# Patient Record
Sex: Female | Born: 1940 | Race: White | Hispanic: No | Smoking: Former smoker
Health system: Southern US, Community
[De-identification: ages and names within clinical notes are randomized; demographics above are authoritative.]

## PROBLEM LIST (undated history)

## (undated) DIAGNOSIS — F0390 Unspecified dementia without behavioral disturbance: Secondary | ICD-10-CM

## (undated) DIAGNOSIS — I350 Nonrheumatic aortic (valve) stenosis: Secondary | ICD-10-CM

## (undated) DIAGNOSIS — I4891 Unspecified atrial fibrillation: Secondary | ICD-10-CM

## (undated) DIAGNOSIS — I10 Essential (primary) hypertension: Secondary | ICD-10-CM

## (undated) DIAGNOSIS — W19XXXA Unspecified fall, initial encounter: Secondary | ICD-10-CM

## (undated) DIAGNOSIS — F329 Major depressive disorder, single episode, unspecified: Secondary | ICD-10-CM

## (undated) DIAGNOSIS — F17200 Nicotine dependence, unspecified, uncomplicated: Secondary | ICD-10-CM

## (undated) DIAGNOSIS — I509 Heart failure, unspecified: Secondary | ICD-10-CM

## (undated) DIAGNOSIS — E039 Hypothyroidism, unspecified: Secondary | ICD-10-CM

## (undated) DIAGNOSIS — E079 Disorder of thyroid, unspecified: Secondary | ICD-10-CM

## (undated) DIAGNOSIS — J449 Chronic obstructive pulmonary disease, unspecified: Secondary | ICD-10-CM

## (undated) DIAGNOSIS — D649 Anemia, unspecified: Secondary | ICD-10-CM

## (undated) DIAGNOSIS — I429 Cardiomyopathy, unspecified: Secondary | ICD-10-CM

## (undated) DIAGNOSIS — M1990 Unspecified osteoarthritis, unspecified site: Secondary | ICD-10-CM

## (undated) HISTORY — PX: CARDIAC SURGERY: SHX584

## (undated) HISTORY — PX: PACEMAKER INSERTION: SHX728

## (undated) HISTORY — PX: ABDOMINAL HYSTERECTOMY: SHX81

## (undated) HISTORY — PX: APPENDECTOMY: SHX54

---

## 1998-01-04 ENCOUNTER — Other Ambulatory Visit: Admission: RE | Admit: 1998-01-04 | Discharge: 1998-01-04 | Payer: Self-pay | Admitting: Obstetrics and Gynecology

## 1999-01-12 ENCOUNTER — Other Ambulatory Visit: Admission: RE | Admit: 1999-01-12 | Discharge: 1999-01-12 | Payer: Self-pay | Admitting: Obstetrics and Gynecology

## 2000-02-20 ENCOUNTER — Other Ambulatory Visit: Admission: RE | Admit: 2000-02-20 | Discharge: 2000-02-20 | Payer: Self-pay | Admitting: *Deleted

## 2003-01-08 ENCOUNTER — Encounter: Admission: RE | Admit: 2003-01-08 | Discharge: 2003-01-08 | Payer: Self-pay | Admitting: Family Medicine

## 2003-11-19 ENCOUNTER — Encounter: Admission: RE | Admit: 2003-11-19 | Discharge: 2003-11-19 | Payer: Self-pay | Admitting: Family Medicine

## 2006-12-10 ENCOUNTER — Encounter: Admission: RE | Admit: 2006-12-10 | Discharge: 2006-12-10 | Payer: Self-pay | Admitting: Family Medicine

## 2012-08-18 DIAGNOSIS — I509 Heart failure, unspecified: Secondary | ICD-10-CM

## 2012-10-29 DIAGNOSIS — I429 Cardiomyopathy, unspecified: Secondary | ICD-10-CM | POA: Insufficient documentation

## 2012-10-29 DIAGNOSIS — I350 Nonrheumatic aortic (valve) stenosis: Secondary | ICD-10-CM

## 2016-08-07 DIAGNOSIS — I509 Heart failure, unspecified: Secondary | ICD-10-CM | POA: Insufficient documentation

## 2016-08-07 DIAGNOSIS — J449 Chronic obstructive pulmonary disease, unspecified: Secondary | ICD-10-CM | POA: Diagnosis present

## 2016-08-08 ENCOUNTER — Encounter (HOSPITAL_COMMUNITY): Payer: Self-pay

## 2016-08-08 ENCOUNTER — Emergency Department (HOSPITAL_COMMUNITY): Payer: Medicare Other

## 2016-08-08 ENCOUNTER — Observation Stay (HOSPITAL_COMMUNITY)
Admission: EM | Admit: 2016-08-08 | Discharge: 2016-08-09 | Disposition: A | Discharge status: Home / Self Care | Payer: Medicare Other | Attending: Internal Medicine | Admitting: Internal Medicine

## 2016-08-08 DIAGNOSIS — X58XXXS Exposure to other specified factors, sequela: Secondary | ICD-10-CM | POA: Diagnosis not present

## 2016-08-08 DIAGNOSIS — Z681 Body mass index (BMI) 19 or less, adult: Secondary | ICD-10-CM | POA: Diagnosis not present

## 2016-08-08 DIAGNOSIS — E46 Unspecified protein-calorie malnutrition: Secondary | ICD-10-CM | POA: Insufficient documentation

## 2016-08-08 DIAGNOSIS — I4510 Unspecified right bundle-branch block: Secondary | ICD-10-CM | POA: Insufficient documentation

## 2016-08-08 DIAGNOSIS — Z803 Family history of malignant neoplasm of breast: Secondary | ICD-10-CM | POA: Diagnosis not present

## 2016-08-08 DIAGNOSIS — I350 Nonrheumatic aortic (valve) stenosis: Secondary | ICD-10-CM | POA: Diagnosis not present

## 2016-08-08 DIAGNOSIS — I255 Ischemic cardiomyopathy: Secondary | ICD-10-CM | POA: Insufficient documentation

## 2016-08-08 DIAGNOSIS — I5022 Chronic systolic (congestive) heart failure: Secondary | ICD-10-CM | POA: Insufficient documentation

## 2016-08-08 DIAGNOSIS — E876 Hypokalemia: Principal | ICD-10-CM | POA: Diagnosis present

## 2016-08-08 DIAGNOSIS — I080 Rheumatic disorders of both mitral and aortic valves: Secondary | ICD-10-CM | POA: Diagnosis not present

## 2016-08-08 DIAGNOSIS — I351 Nonrheumatic aortic (valve) insufficiency: Secondary | ICD-10-CM | POA: Diagnosis not present

## 2016-08-08 DIAGNOSIS — I272 Pulmonary hypertension, unspecified: Secondary | ICD-10-CM | POA: Insufficient documentation

## 2016-08-08 DIAGNOSIS — I110 Hypertensive heart disease with heart failure: Secondary | ICD-10-CM | POA: Insufficient documentation

## 2016-08-08 DIAGNOSIS — Z888 Allergy status to other drugs, medicaments and biological substances status: Secondary | ICD-10-CM | POA: Insufficient documentation

## 2016-08-08 DIAGNOSIS — I509 Heart failure, unspecified: Secondary | ICD-10-CM

## 2016-08-08 DIAGNOSIS — I428 Other cardiomyopathies: Secondary | ICD-10-CM | POA: Diagnosis not present

## 2016-08-08 DIAGNOSIS — Z79899 Other long term (current) drug therapy: Secondary | ICD-10-CM | POA: Diagnosis not present

## 2016-08-08 DIAGNOSIS — Z7982 Long term (current) use of aspirin: Secondary | ICD-10-CM | POA: Diagnosis not present

## 2016-08-08 DIAGNOSIS — I340 Nonrheumatic mitral (valve) insufficiency: Secondary | ICD-10-CM | POA: Diagnosis not present

## 2016-08-08 DIAGNOSIS — J449 Chronic obstructive pulmonary disease, unspecified: Secondary | ICD-10-CM | POA: Diagnosis not present

## 2016-08-08 DIAGNOSIS — R0600 Dyspnea, unspecified: Secondary | ICD-10-CM

## 2016-08-08 DIAGNOSIS — D649 Anemia, unspecified: Secondary | ICD-10-CM | POA: Diagnosis not present

## 2016-08-08 DIAGNOSIS — E43 Unspecified severe protein-calorie malnutrition: Secondary | ICD-10-CM | POA: Insufficient documentation

## 2016-08-08 DIAGNOSIS — Z881 Allergy status to other antibiotic agents status: Secondary | ICD-10-CM | POA: Diagnosis not present

## 2016-08-08 DIAGNOSIS — F17210 Nicotine dependence, cigarettes, uncomplicated: Secondary | ICD-10-CM | POA: Insufficient documentation

## 2016-08-08 DIAGNOSIS — S42031S Displaced fracture of lateral end of right clavicle, sequela: Secondary | ICD-10-CM | POA: Insufficient documentation

## 2016-08-08 DIAGNOSIS — R64 Cachexia: Secondary | ICD-10-CM

## 2016-08-08 LAB — CBC
HCT: 40.6 % (ref 36.0–46.0)
Hemoglobin: 13.1 g/dL (ref 12.0–15.0)
MCH: 29.6 pg (ref 26.0–34.0)
MCHC: 32.3 g/dL (ref 30.0–36.0)
MCV: 91.9 fL (ref 78.0–100.0)
Platelets: 319 10*3/uL (ref 150–400)
RBC: 4.42 MIL/uL (ref 3.87–5.11)
RDW: 14 % (ref 11.5–15.5)
WBC: 9.3 10*3/uL (ref 4.0–10.5)

## 2016-08-08 LAB — COMPREHENSIVE METABOLIC PANEL
ALT: 14 U/L (ref 14–54)
AST: 24 U/L (ref 15–41)
Albumin: 3.7 g/dL (ref 3.5–5.0)
Alkaline Phosphatase: 88 U/L (ref 38–126)
Anion gap: 11 (ref 5–15)
BUN: 5 mg/dL — ABNORMAL LOW (ref 6–20)
CO2: 36 mmol/L — ABNORMAL HIGH (ref 22–32)
Calcium: 8.9 mg/dL (ref 8.9–10.3)
Chloride: 94 mmol/L — ABNORMAL LOW (ref 101–111)
Creatinine, Ser: 0.95 mg/dL (ref 0.44–1.00)
GFR calc Af Amer: >60 mL/min (ref >60)
GFR calc non Af Amer: 57 mL/min — ABNORMAL LOW (ref >60)
Glucose, Bld: 106 mg/dL — ABNORMAL HIGH (ref 65–99)
Potassium: <2 mmol/L — CL (ref 3.5–5.1)
Sodium: 141 mmol/L (ref 135–145)
Total Bilirubin: 1.1 mg/dL (ref 0.3–1.2)
Total Protein: 6.8 g/dL (ref 6.5–8.1)

## 2016-08-08 LAB — BASIC METABOLIC PANEL
Anion gap: 10 (ref 5–15)
BUN: 7 mg/dL (ref 6–20)
CHLORIDE: 100 mmol/L — AB (ref 101–111)
CO2: 32 mmol/L (ref 22–32)
CREATININE: 0.98 mg/dL (ref 0.44–1.00)
Calcium: 8.5 mg/dL — ABNORMAL LOW (ref 8.9–10.3)
GFR calc Af Amer: >60 mL/min (ref >60)
GFR, EST NON AFRICAN AMERICAN: 55 mL/min — AB (ref >60)
GLUCOSE: 175 mg/dL — AB (ref 65–99)
Potassium: 2.9 mmol/L — ABNORMAL LOW (ref 3.5–5.1)
SODIUM: 142 mmol/L (ref 135–145)

## 2016-08-08 LAB — TYPE AND SCREEN
ABO/RH(D): O POS
Antibody Screen: NEGATIVE

## 2016-08-08 LAB — MAGNESIUM: Magnesium: 1.8 mg/dL (ref 1.7–2.4)

## 2016-08-08 LAB — ABO/RH: ABO/RH(D): O POS

## 2016-08-08 MED ORDER — POTASSIUM CHLORIDE CRYS ER 20 MEQ PO TBCR
60.0000 meq | EXTENDED_RELEASE_TABLET | Freq: Once | ORAL | Status: AC
  Administered 2016-08-08: 60 meq via ORAL
  Filled 2016-08-08: qty 3

## 2016-08-08 MED ORDER — IPRATROPIUM-ALBUTEROL 0.5-2.5 (3) MG/3ML IN SOLN
3.0000 mL | Freq: Once | RESPIRATORY_TRACT | Status: AC
Start: 2016-08-08 — End: 2016-08-08
  Administered 2016-08-08: 3 mL via RESPIRATORY_TRACT
  Filled 2016-08-08: qty 3

## 2016-08-08 MED ORDER — POTASSIUM CHLORIDE 10 MEQ/100ML IV SOLN
10.0000 meq | INTRAVENOUS | Status: AC
  Administered 2016-08-08 (×4): 10 meq via INTRAVENOUS
  Filled 2016-08-08 (×4): qty 100

## 2016-08-08 MED ORDER — SODIUM CHLORIDE 0.9% FLUSH
3.0000 mL | Freq: Two times a day (BID) | INTRAVENOUS | Status: DC
  Administered 2016-08-08 – 2016-08-09 (×3): 3 mL via INTRAVENOUS

## 2016-08-08 MED ORDER — ASPIRIN EC 81 MG PO TBEC
81.0000 mg | DELAYED_RELEASE_TABLET | ORAL | Status: DC
  Administered 2016-08-08: 81 mg via ORAL
  Filled 2016-08-08: qty 1

## 2016-08-08 MED ORDER — SODIUM CHLORIDE 0.9 % IV SOLN
Freq: Once | INTRAVENOUS | Status: AC
  Administered 2016-08-08: 12:00:00 via INTRAVENOUS

## 2016-08-08 MED ORDER — POLYETHYLENE GLYCOL 3350 17 G PO PACK: 17.0000 g | PACK | Freq: Every day | ORAL | Status: DC | PRN

## 2016-08-08 MED ORDER — ORAL CARE MOUTH RINSE
15.0000 mL | Freq: Two times a day (BID) | OROMUCOSAL | Status: DC
  Administered 2016-08-08 – 2016-08-09 (×2): 15 mL via OROMUCOSAL

## 2016-08-08 MED ORDER — ACETAMINOPHEN 650 MG RE SUPP: 650.0000 mg | Freq: Four times a day (QID) | RECTAL | Status: DC | PRN

## 2016-08-08 MED ORDER — MAGNESIUM SULFATE 2 GM/50ML IV SOLN
2.0000 g | Freq: Once | INTRAVENOUS | Status: AC
  Administered 2016-08-08: 2 g via INTRAVENOUS
  Filled 2016-08-08: qty 50

## 2016-08-08 MED ORDER — ENSURE ENLIVE PO LIQD
237.0000 mL | Freq: Two times a day (BID) | ORAL | Status: DC
  Administered 2016-08-08 – 2016-08-09 (×2): 237 mL via ORAL

## 2016-08-08 MED ORDER — ENOXAPARIN SODIUM 40 MG/0.4ML ~~LOC~~ SOLN
40.0000 mg | SUBCUTANEOUS | Status: DC
  Administered 2016-08-08: 40 mg via SUBCUTANEOUS
  Filled 2016-08-08: qty 0.4

## 2016-08-08 MED ORDER — MOMETASONE FURO-FORMOTEROL FUM 200-5 MCG/ACT IN AERO
2.0000 | INHALATION_SPRAY | Freq: Two times a day (BID) | RESPIRATORY_TRACT | Status: DC
  Administered 2016-08-08 – 2016-08-09 (×2): 2 via RESPIRATORY_TRACT
  Filled 2016-08-08: qty 8.8

## 2016-08-08 MED ORDER — ACETAMINOPHEN 325 MG PO TABS: 650.0000 mg | ORAL_TABLET | Freq: Four times a day (QID) | ORAL | Status: DC | PRN

## 2016-08-08 NOTE — ED Triage Notes (Signed)
Patient states that she was called last night following her physical yesterday and told to come to ED immediately for very low hemoglobin, denies pain, complains of general fatigue for several weeks

## 2016-08-08 NOTE — ED Provider Notes (Signed)
[facility]-EMERGENCY DEPT Provider Note   CSN: [id number] Arrival date & time: 08/08/16  1002     History   Chief Complaint Chief Complaint  Patient presents with  . low hemoglobin    HPI Karen Larson is a 76 y.o. female with history of COPD, anemia, hypokalemia and presents with report of a low potassium after her PCP called her with lab results. Patient has had ongoing fatigue for the past 3-4 months. She is also had shortness of breath that has been similar to her past COPD. Her shortness of breath is worse on exertion. She is supposed to be taking Symbicort twice daily, but admits to not taking it. Patient has a chronic cough. She is a smoker. Patient denies any chest pain, fever, abdominal pain, nausea, vomiting, urinary symptoms.  HPI  History reviewed. No pertinent past medical history.  Patient Active Problem List   Diagnosis Date Noted  . Hypokalemia 08/08/2016    History reviewed. No pertinent surgical history.  OB History    No data available       Home Medications    Prior to Admission medications   Medication Sig Start Date End Date Taking? Authorizing Provider  aspirin EC 81 MG tablet Take 81 mg by mouth every other day.   Yes [provider]  budesonide-formoterol (SYMBICORT) 160-4.5 MCG/ACT inhaler Take 1 puff by mouth daily. 07/04/15  Yes [provider]  furosemide (LASIX) 20 MG tablet Take 20 mg by mouth daily. 05/11/16  Yes [provider]  ibuprofen (ADVIL,MOTRIN) 800 MG tablet Take 800 mg by mouth 2 (two) times daily. 08/07/16  Yes [provider]  losartan (COZAAR) 25 MG tablet Take 25 mg by mouth daily. 04/10/16  Yes [provider]  metoprolol succinate (TOPROL-XL) 25 MG 24 hr tablet Take 25 mg by mouth daily. 05/11/16  Yes [provider]  potassium chloride (KLOR-CON M10) 10 MEQ tablet Take 10 mEq by mouth 2 (two) times daily. 08/26/15  Yes [provider]    Family History No family history on  file.  Social History Social History  Substance Use Topics  . Smoking status: Current Every Day Smoker  . Smokeless tobacco: Never Used  . Alcohol use Not on file     Allergies   Tiotropium bromide monohydrate; Doxycycline; and Levaquin  [levofloxacin]   Review of Systems Review of Systems  Constitutional: Positive for fatigue. Negative for chills and fever.  HENT: Negative for facial swelling and sore throat.   Respiratory: Positive for cough and shortness of breath.   Cardiovascular: Negative for chest pain.  Gastrointestinal: Negative for abdominal pain, nausea and vomiting.  Genitourinary: Negative for dysuria.  Musculoskeletal: Negative for back pain.  Skin: Negative for rash and wound.  Neurological: Positive for weakness (generalized). Negative for headaches.  Psychiatric/Behavioral: The patient is not nervous/anxious.      Physical Exam Updated Vital Signs BP 105/63   Pulse 84   Temp 97.5 F (36.4 C) (Oral)   Resp (!) 25   Ht 5\' 6"  (1.676 m)   Wt 51.7 kg (114 lb)   SpO2 96%   BMI 18.40 kg/m   Physical Exam  Constitutional: She appears well-developed and well-nourished. No distress.  HENT:  Head: Normocephalic and atraumatic.  Mouth/Throat: Oropharynx is clear and moist. No oropharyngeal exudate.  Eyes: Conjunctivae are normal. Pupils are equal, round, and reactive to light. Right eye exhibits no discharge. Left eye exhibits no discharge. No scleral icterus.  Neck: Normal range of  motion. Neck supple. No thyromegaly present.  Cardiovascular: Normal rate, regular rhythm, normal heart sounds and intact distal pulses.  Exam reveals no gallop and no friction rub.   No murmur heard. Pulmonary/Chest: Effort normal. No stridor. No respiratory distress. She has wheezes (expiratory bilateral). She has rhonchi (expiratory bilteral). She has no rales.  Abdominal: Soft. Bowel sounds are normal. She exhibits no distension. There is no tenderness. There is no rebound and  no guarding.  Musculoskeletal: She exhibits no edema.  Lymphadenopathy:    She has no cervical adenopathy.  Neurological: She is alert. Coordination normal.  Skin: Skin is warm and dry. No rash noted. She is not diaphoretic. No pallor.  Psychiatric: She has a normal mood and affect.  Nursing note and vitals reviewed.    ED Treatments / Results  Labs (all labs ordered are listed, but only abnormal results are displayed) Labs Reviewed  COMPREHENSIVE METABOLIC PANEL - Abnormal; Notable for the following:       Result Value   Potassium <2.0 (*)    Chloride 94 (*)    CO2 36 (*)    Glucose, Bld 106 (*)    BUN 5 (*)    GFR calc non Af Amer 57 (*)    All other components within normal limits  CBC  MAGNESIUM  TYPE AND SCREEN  ABO/RH    EKG  EKG Interpretation  Date/Time:  Wednesday August 08 2016 10:22:42 EDT Ventricular Rate:  90 PR Interval:    QRS Duration: 137 QT Interval:  428 QTC Calculation: 512 R Axis:   -84 Text Interpretation:  Sinus rhythm Multiple ventricular premature complexes Right bundle branch block Anteroseptal infarct, age indeterminate No previous ECGs available Confirmed by Frederick Peers [phone]) on 08/08/2016 10:59:03 AM       Radiology Dg Chest 2 View  Result Date: 08/08/2016 CLINICAL DATA:  Shortness of breath, worsening for the past few months. EXAM: CHEST  2 VIEW COMPARISON:  None. FINDINGS: Hyperinflation. Patient has history of smoking. There is no edema, consolidation, effusion, or pneumothorax. Normal heart size and mediastinal contours. Remote distal right clavicle fracture with hypertrophic healing. IMPRESSION: 1. No acute finding. 2. Hyperinflation suggesting COPD in this smoker. Electronically Signed   By: Marnee Spring M.D.   On: 08/08/2016 11:49    Procedures Procedures (including critical care time)  Medications Ordered in ED Medications  potassium chloride 10 mEq in 100 mL IVPB (0 mEq Intravenous Stopped 08/08/16 1251)  magnesium  sulfate IVPB 2 g 50 mL (0 g Intravenous Stopped 08/08/16 1251)  potassium chloride SA (K-DUR,KLOR-CON) CR tablet 60 mEq (60 mEq Oral Given 08/08/16 1132)  ipratropium-albuterol (DUONEB) 0.5-2.5 (3) MG/3ML nebulizer solution 3 mL (3 mLs Nebulization Given 08/08/16 1206)  0.9 %  sodium chloride infusion ( Intravenous New Bag/Given 08/08/16 1151)     Initial Impression / Assessment and Plan / ED Course  I have reviewed the triage vital signs and the nursing notes.  Pertinent labs & imaging results that were available during my care of the patient were reviewed by me and considered in my medical decision making (see chart for details).     Patient with profound hypokalemia. CBC is unremarkable. Potassium is less than 2.0. Chloride 94, CO2 36. Magnesium 1.8. CXR shows no acute findings, but hyperinflation suggesting COPD. Patient given DuoNeb considering increasing shortness of breath and wheezing and rhonchi on auscultation. EKG shows NSR, multiple PVCs, right bundle branch block. IV and oral potassium initiated in the ED. I consulted the  internal medicine teaching service who will admit the patient for further evaluation and treatment. Patient understands and agrees with plan. Patient also evaluated by Dr. Clarene Duke who guided the patient's management and agrees with plan.  Final Clinical Impressions(s) / ED Diagnoses   Final diagnoses:  Hypokalemia    New Prescriptions New Prescriptions   No medications on file     Emi Holes, Cordelia Poche 08/08/16 1308    Little, Ambrose Finland, MD 08/09/16 [phone]

## 2016-08-08 NOTE — H&P (Signed)
Date: 08/08/2016               Patient Name:  Karen Larson MRN: [id number]  DOB: 1940/04/26 Age / Sex: 76 y.o., female   PCP: System, Pcp Not In         Medical Service: Internal Medicine Teaching Service         Attending Physician: Dr. Mikey Bussing, Marthenia Rolling, DO    First Contact: Dr. Obie Dredge Pager: [phone]  Second Contact: Dr. Earlene Plater Pager: [phone]       After Hours (After 5p/  First Contact Pager: [phone]  weekends / holidays): Second Contact Pager: [phone]   Chief Complaint: "my doctor told me to go for an abnormal lab"   History of Present Illness: 76 yo woman with PMH nonischemic cardiomyopathy, chronic systolic CHF (EF 07/7844 25-30%), mild aortic regurg, mild-mod mitral regurgitation, COPD presented to the ED for a lab abnormality. She went to her new primary care doctors office yesterday and had lab work drawn, her doctor called her that evening and said she needed to report to the ED immediately for a lab abnormality. When she arrived at the ED she was found to have a potassium of <2. She reports that she has been feeling well and was just reporting to her PCP for a checkup. She does note that shes had decreased energy for the past 3 months and decreased appetite and 10 lbs of weight loss in the past 3 months. She also notes she has been more tired with increased shortness of breath when she mows the lawn. She denies chest pain, decreased fluid intake, changes in sleep, nausea, vomiting, diarrhea. She denies orthopnea, PND, or leg swelling. She reports that she has been out of her potassium supplements for the past 3 years, otherwise she is not able to report her medications.   The nurse on her floor did receive a call later in the afternoon from the patients friend who reported that the patient has been intentionally starving herself.   Meds:  Current Meds  Medication Sig  . aspirin EC 81 MG tablet Take 81 mg by mouth every other day.  . budesonide-formoterol (SYMBICORT) 160-4.5  MCG/ACT inhaler Take 1 puff by mouth daily.  . furosemide (LASIX) 20 MG tablet Take 20 mg by mouth daily.  Marland Kitchen ibuprofen (ADVIL,MOTRIN) 800 MG tablet Take 800 mg by mouth 2 (two) times daily.  Marland Kitchen losartan (COZAAR) 25 MG tablet Take 25 mg by mouth daily.  . metoprolol succinate (TOPROL-XL) 25 MG 24 hr tablet Take 25 mg by mouth daily.  . potassium chloride (KLOR-CON M10) 10 MEQ tablet Take 10 mEq by mouth 2 (two) times daily.     Allergies: Allergies as of 08/08/2016 - Review Complete 08/08/2016  Allergen Reaction Noted  . Tiotropium bromide monohydrate Nausea Only 04/24/2016  . Doxycycline Nausea Only 03/11/2015  . Levaquin  [levofloxacin] Nausea Only 03/11/2015   History reviewed. No pertinent past medical history.  Family History: Sister with breast cancer, mother and father died of old age   Social History: Reports that now she smokes cigarettes intermittently but previously she was smoking one pack per day since age 47. Denies alcohol or illicit drug use.   Review of Systems: A complete ROS was negative except as per HPI.   Physical Exam: Blood pressure 104/73, pulse 79, temperature 97.8 F (36.6 C), temperature source Oral, resp. rate 17, height 5\' 6"  (1.676 m), weight 113 lb 15.7 oz (51.7 kg), SpO2 99 %.  Physical Exam  Constitutional: She is oriented to person, place, and time. She appears well-developed and well-nourished. No distress.  Thin appearing   HENT:  Head: Normocephalic and atraumatic.  Mouth/Throat: No oropharyngeal exudate.  Eyes: Conjunctivae are normal. Right eye exhibits no discharge. Left eye exhibits no discharge. No scleral icterus.  Cardiovascular: Normal rate and regular rhythm.   2/6 systolic murmur, no peripheral edema   Pulmonary/Chest: Effort normal and breath sounds normal. No respiratory distress. She has no wheezes. She has no rales.  Abdominal: Soft. She exhibits no distension. There is no tenderness.  Scaphoid abdomen   Neurological: She is  alert and oriented to person, place, and time. No cranial nerve deficit.  Skin: Skin is warm and dry. She is not diaphoretic. No pallor.  Skin is tan, actinic keratosis   Psychiatric: She has a normal mood and affect. Her behavior is normal.    EKG: sinus rhythm, normal axis, right bundle branch block, unchanged from prior report in care everywhere 02/2016    CXR: personal review of the chest xray reveals hyperinflation, no acute cardiopulmonary disease   Assessment & Plan by Problem: Principal Problem:   Hypokalemia Active Problems:   Hypokalemia due to loss of potassium   Aortic stenosis   CHF (congestive heart failure) (HCC)   Chronic obstructive pulmonary disease (HCC)   Dyspnea on exertion  Hypokalemia  K < 2 on admission, she has received 160 meq of potassium but 6pm this evening. She does describe weakness and decreased appetite. Denies diarrhea or vomiting. EKG is consistent with prior. Most likely related her hypokalemia without potassium supplementation and continued lasix use.  - hold home lasix  - Follow up BMP this evening, will repleat as needed -telemetry monitoring   Aortic stenosis  Describes worsening of DOE over the past 3 months which could be related to worsening of her aortic stenosis. She is euvolemic on exam and does not have symptoms consistent with COPD exacerbation.  - maintain adequate fluids  - ordered echocardiogram   Malnutrition  She appears cachectic and describes a poor appetite, the etiology of this is unclear. She had a colonoscopy in 2013 and reports that it was normal.  - ordered ensure   Chronic systolic CHF  Euvolemic on exam and no symptoms of orthopnea. Last EKG 03/2013: left ventricular hypertrophy, LVEF 25-30%.  - follow up EKG   Dispo: Admit patient to Observation with expected length of stay less than 2 midnights.  Signed: Eulah Pont, MD 08/08/2016, 6:46 PM  Pager: [phone]

## 2016-08-08 NOTE — H&P (Deleted)
Date: 08/08/2016               Patient Name:  Karen Larson MRN: [id number]  DOB: 18-Oct-1940 Age / Sex: 76 y.o., female   PCP: System, Pcp Not In              Medical Service: Internal Medicine Teaching Service              Attending Physician: Dr. Mikey Bussing, Marthenia Rolling, DO    First Contact: Macarthur Critchley, MS3 Pager: [phone]  Second Contact: Dr. Obie Dredge Pager: [phone]  Third Contact Dr. Earlene Plater Pager: [phone]       After Hours (After 5p/  First Contact Pager: [phone]  weekends / holidays): Second Contact Pager: [phone]   Chief Complaint: "Doctor said come in because labs are low"  History of Present Illness: [name] is a 76 y/o woman with PMH of hypokalemia, squamous cell carcinoma, COPD, congestive heart failure in setting of aortic stenosis who presents to ED at the advise of her physician due to hypokalemia. She states that she saw a new PCP yesterday, who drew blood for various labs. Her physician called her last night around 9 pm and advised the pt. To go to the ED due to "low labs." The pt. Waited until this AM to come to the ED. She states she has no other symptoms that prompted her.  Upon further questioning, the patient endorses feeling more tired for the past 3 months. She states that during this time she has had less of an appetite and has lost approximately 10 lbs. She states that she used to be able to mow her entire lawn, but that recently she has had to stop much earlier because she would get out of breath. She denies chest pain or passing out. She says she has been sleeping in later than usual for the past few months, but denies using any more than 1 pillow. Pt. States that she has not been taking her K+ supplement for a few months because she ran out.   The pt's sister arrived shortly after and more discussion occurred. The pt's sister states that the pt. Has been drinking 2-3 Monster energy drinks per day and has become increasingly jittery as well as "abnormal behavior such  as driving around more frequently when she used to be a "home body.". Furthermore, the sister and pt. Recount that the pt. Has developed both urinary and bowel incontinence. The sister believes the constellation of behavior is due to increased energy drinks.  Meds: Current Facility-Administered Medications  Medication Dose Route Frequency Provider Last Rate Last Dose  . potassium chloride 10 mEq in 100 mL IVPB  10 mEq Intravenous Q1 Hr x 4 Law, Alexandra M, PA-C   Stopped at 08/08/16 1251   Current Outpatient Prescriptions  Medication Sig Dispense Refill  . aspirin EC 81 MG tablet Take 81 mg by mouth every other day.    . budesonide-formoterol (SYMBICORT) 160-4.5 MCG/ACT inhaler Take 1 puff by mouth daily.    . furosemide (LASIX) 20 MG tablet Take 20 mg by mouth daily.    Marland Kitchen ibuprofen (ADVIL,MOTRIN) 800 MG tablet Take 800 mg by mouth 2 (two) times daily.    Marland Kitchen losartan (COZAAR) 25 MG tablet Take 25 mg by mouth daily.    . metoprolol succinate (TOPROL-XL) 25 MG 24 hr tablet Take 25 mg by mouth daily.  1  . potassium chloride (KLOR-CON M10) 10 MEQ tablet Take 10 mEq by mouth 2 (two) times  daily.      Allergies: Allergies as of 08/08/2016 - Review Complete 08/08/2016  Allergen Reaction Noted  . Tiotropium bromide monohydrate Nausea Only 04/24/2016  . Doxycycline Nausea Only 03/11/2015  . Levaquin  [levofloxacin] Nausea Only 03/11/2015   Family history:  -Breast cancer: sister -COPD: father (allegedly non-smoker)  Social History:  -Started smoking 1 pk/day at age 26. She quit then restarted. Approximately 50 pk/yrs -No alcohol use -No illicit drug use  Review of Systems: General: Endorses fatigue, weight loss; denies subjective fevers, night sweats Skin: Denies nail changes, rashes HEENT: Denies nausea, vomiting, vision changes, hearing changes CV: Denies chest pain Respiratory: Endorses SOB upon exertion, increased dry cough; denies sputum production GI: Endorses decreased  appetite; denies nausea, vomiting, change in bowel frequency or stool size,color, consistency. Denies hematochezia or melena. Denies abdominal pain  GU: Denies change in urine frequency, gross hematuria, dysuria  Physical Exam: Blood pressure 105/63, pulse 84, temperature 97.5 F (36.4 C), temperature source Oral, resp. rate (!) 25, height 5\' 6"  (1.676 m), weight 51.7 kg (114 lb), SpO2 96 %.  General: NAD, comfortable Skin, nails: Face, arms, and legs tan on sun-exposed areas; actinic keratoses on face. No hyperpigmentation. No nail clubbing. CV: RRR, systolic murmur, no peripheral edema Pulm: Clear to auscultation bilaterally. No wheezes, crackles Abdomen: Soft, nontender. No hepatosplenomegaly Neuro: CN 3-12 intact   Lab results: Basic Metabolic Panel:  Recent Labs  16/10/96 1009 08/08/16 1032  NA 141  --   K <2.0*  --   CL 94*  --   CO2 36*  --   GLUCOSE 106*  --   BUN 5*  --   CREATININE 0.95  --   CALCIUM 8.9  --   MG  --  1.8   Liver Function Tests:  Recent Labs  08/08/16 1009  AST 24  ALT 14  ALKPHOS 88  BILITOT 1.1  PROT 6.8  ALBUMIN 3.7   CBC:  Recent Labs  08/08/16 1009  WBC 9.3  HGB 13.1  HCT 40.6  MCV 91.9  PLT 319    Imaging results:  Dg Chest 2 View  Result Date: 08/08/2016 CLINICAL DATA:  Shortness of breath, worsening for the past few months. EXAM: CHEST  2 VIEW COMPARISON:  None. FINDINGS: Hyperinflation. Patient has history of smoking. There is no edema, consolidation, effusion, or pneumothorax. Normal heart size and mediastinal contours. Remote distal right clavicle fracture with hypertrophic healing. IMPRESSION: 1. No acute finding. 2. Hyperinflation suggesting COPD in this smoker. Electronically Signed   By: Marnee Spring M.D.   On: 08/08/2016 11:49    ECG: Sinus rhythm, multiple ventricular premature complexes, RBBB, anteroseptal infarct (age indeterminate)  Assessment & Plan by Problem: Active Problems:   Hypokalemia  Ms.  Kass is a 76 y/o woman with PMH of hypokalemia, squamous cell carcinoma, COPD, congestive heart failure in setting of aortic stenosis who presents to ED with asymptomatic hypokalemia.   Hypokalemic, hypochloremic, metabolic alkalosis: Top of Ddx includes persistent vomiting, loop diuretic. In the setting of low magnesium, loop diuretic adverse effect vs. Abuse is high on differential. Pt. States she has not been taking her prescribed K+ supplement for the past few months because she ran out. Furosemide in setting of lack of K+ supplementation is likely cause of her hypokalemia.   -Klor-Con 750 mg (10 mEq), 2 tablets, q6h for 24 hours - 20 mEq/hr in NS IV for 3 hours -Trend BMP q4h -Telemetry  Hypomagnesemia: Ddx. Includes loop diuretic, diarrhea, other  meds (eg. Aminoglycosides, long term PPI). Magnesium 1.8 (low normal). Asymptomatic. Pt. Denies diarrhea. Most likely due to furosemide.   -HOLD furosemide  Shortness of breath with exertion: Pt. Complains of 3 months worsening SOB, in setting of COPD, CHF, and aortic stenosis. Pt. Saturating >96% on RA, clinically appears in no acute distress without difficulty breathing, with normal respiratory effort. Unlikely to be COPD or CHF exacerbation.  -Trend clinically -Outpatient meds. For COPD  -Consider 6 minute walk test in outpt. Setting for reassessment of O2 requirements  Decreased appetite, weight loss: Pt. Endorses anorexia and weight loss over the past 3 months. Sister states pt. Has been drinking a lot of neregy drinks (2-3 Monster energy drinks). Pt. Weighed 51.3 kg 05/2015, her same weight on admission today. Objectively, she has not lost weight. In 2013, it appears she had a wellness check up that included a colonoscopy---but results seem unavailable. She is not anemic. Pt. Denies hematochezia, change in bowel habits. Colorectal cancer low on differential. Normal total protein, albumin-->prolonged starvation low on differential as well.  Lung cancer low given normal CXR, no nail clubbing.   -Find colonoscopy results  COPD: Followed by Dr. Marliss Coots. Last PFTs 04/24/2016: FEV1 56%, FVC 66%, FEV1/FVC 59%, DLCO 49% Pt. Does not appear to be having exacerbation given normal vitals, and clinically stable in no distress. CXR sig. Only for unchanged mild hyperinflation.   -continue home Symbicort -Outpatient management  CHF in setting of aortic stenosis: Followed by Dr. Juliane Lack. No peripheral swelling, pulmonary edema on CXR, clear lungs on exam. EF 30% per 05/04/2015 cardiology note. Last echo significant for AS and mitral regurgitation. Less likely to be CHF exacerbation, but must be investigated  -ECG -Echo-->she is due for one per Dr. 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  The care of the patient was discussed with Dr. Obie Dredge and the assessment and plan was formulated with their assistance.  Please see their note for official documentation of the patient encounter.   Signed: Cyndra Numbers, Medical Student 08/08/2016, 1:49 PM

## 2016-08-08 NOTE — Progress Notes (Signed)
Karen Larson is a 76 y.o. female patient admitted from ED awake, alert - oriented  X 4 - no acute distress noted.  VSS - Blood pressure 104/73, pulse 79, temperature 97.8 F (36.6 C), temperature source Oral, resp. rate 17, height 5\' 6"  (1.676 m), weight 51.7 kg (113 lb 15.7 oz), SpO2 99 %.    IV in place, occlusive dsg intact without redness.  Orientation to room, and floor completed with information packet given to patient/family.  Patient declined safety video at this time.  Admission INP armband ID verified with patient/family, and in place.   SR up x 2, fall assessment complete, with patient and able to verbalize understanding of risk associated with falls, and verbalized understanding to call nsg before up out of bed.  Call light within reach, patient able to voice, and demonstrate understanding.  Skin is excessively dry with generalized abrasions from dryness.   Upon arrival to the floor a friend of Karen Larson called and wanted [place] to know that "she has been having dark tarry stools but wont tell the doctor about them, she wont eat and drinks energy drinks all day."  She is very concerned about her friends health but wants to remain annomys about this report.  During admission documentation I asked Karen Larson if she had lost any weight recently due to decrease in appetite. She reports that she has and that a usual day of food includes "a sausage biscuit for breakfast, nothing for lunch and a cookie for dinner." She has agreed to trying some ensure. Provider has been notified of the above findings.   Will cont to eval and treat per MD orders.  Casper Harrison Langley Flatley, RN 08/08/2016 3:54 PM

## 2016-08-08 NOTE — ED Notes (Signed)
Patient transported to X-ray 

## 2016-08-08 NOTE — ED Notes (Signed)
Attempted to call report

## 2016-08-09 ENCOUNTER — Observation Stay (HOSPITAL_BASED_OUTPATIENT_CLINIC_OR_DEPARTMENT_OTHER): Payer: Medicare Other

## 2016-08-09 DIAGNOSIS — I110 Hypertensive heart disease with heart failure: Secondary | ICD-10-CM | POA: Diagnosis not present

## 2016-08-09 DIAGNOSIS — J449 Chronic obstructive pulmonary disease, unspecified: Secondary | ICD-10-CM | POA: Diagnosis not present

## 2016-08-09 DIAGNOSIS — E876 Hypokalemia: Secondary | ICD-10-CM | POA: Diagnosis not present

## 2016-08-09 DIAGNOSIS — I340 Nonrheumatic mitral (valve) insufficiency: Secondary | ICD-10-CM

## 2016-08-09 DIAGNOSIS — E43 Unspecified severe protein-calorie malnutrition: Secondary | ICD-10-CM | POA: Insufficient documentation

## 2016-08-09 DIAGNOSIS — I5022 Chronic systolic (congestive) heart failure: Secondary | ICD-10-CM | POA: Diagnosis not present

## 2016-08-09 LAB — BASIC METABOLIC PANEL
ANION GAP: 9 (ref 5–15)
Anion gap: 8 (ref 5–15)
BUN: 9 mg/dL (ref 6–20)
BUN: 7 mg/dL (ref 6–20)
CALCIUM: 8.6 mg/dL — AB (ref 8.9–10.3)
CALCIUM: 8.6 mg/dL — AB (ref 8.9–10.3)
CHLORIDE: 105 mmol/L (ref 101–111)
CO2: 32 mmol/L (ref 22–32)
CO2: 31 mmol/L (ref 22–32)
CREATININE: 0.94 mg/dL (ref 0.44–1.00)
CREATININE: 0.95 mg/dL (ref 0.44–1.00)
Chloride: 104 mmol/L (ref 101–111)
GFR calc non Af Amer: 58 mL/min — ABNORMAL LOW (ref >60)
GFR calc non Af Amer: 57 mL/min — ABNORMAL LOW (ref >60)
Glucose, Bld: 94 mg/dL (ref 65–99)
Glucose, Bld: 111 mg/dL — ABNORMAL HIGH (ref 65–99)
Potassium: 2.8 mmol/L — ABNORMAL LOW (ref 3.5–5.1)
Potassium: 3.1 mmol/L — ABNORMAL LOW (ref 3.5–5.1)
SODIUM: 145 mmol/L (ref 135–145)
SODIUM: 144 mmol/L (ref 135–145)

## 2016-08-09 LAB — ECHOCARDIOGRAM COMPLETE
AOPV: 0.18 m/s
AOVTI: 99.6 cm
AV Area mean vel: 0.54 cm2
AV Peak grad: 66 mmHg
AV pk vel: 405 cm/s
AV vel: 0.61
AVAREAMEANVIN: 0.34 cm2/m2
AVAREAVTI: 0.58 cm2
AVAREAVTIIND: 0.39 cm2/m2
AVCELMEANRAT: 0.17
AVG: 36 mmHg
AVLVOTPG: 2 mmHg
AVPHT: 228 ms
Area-P 1/2: 2.14 cm2
CHL CUP AV PEAK INDEX: 0.37
CHL CUP AV VALUE AREA INDEX: 0.39
CHL CUP DOP CALC LVOT VTI: 19.5 cm
CHL CUP MV DEC (S): 201
DOP CAL AO MEAN VELOCITY: 284 cm/s
EERAT: 10.55
EWDT: 201 ms
FS: 22 % — AB (ref 28–44)
HEIGHTINCHES: 66 in
IVS/LV PW RATIO, ED: 1
LA diam index: 1.97 cm/m2
LA vol A4C: 22.3 ml
LASIZE: 31 mm
LAVOL: 33.5 mL
LAVOLIN: 21.3 mL/m2
LDCA: 3.14 cm2
LEFT ATRIUM END SYS DIAM: 31 mm
LV E/e' medial: 10.55
LV PW d: 7 mm — AB (ref 0.6–1.1)
LV TDI E'MEDIAL: 6.53
LV e' LATERAL: 6.53 cm/s
LVEEAVG: 10.55
LVOT diameter: 20 mm
LVOT peak VTI: 0.2 cm
LVOT peak vel: 74.6 cm/s
LVOTSV: 61 mL
MV pk A vel: 95.1 m/s
MV pk E vel: 68.9 m/s
P 1/2 time: 59 ms
RV LATERAL S' VELOCITY: 13.5 cm/s
Reg peak vel: 327 cm/s
TAPSE: 19.9 mm
TDI e' lateral: 6.53
TRMAXVEL: 327 cm/s
Valve area: 0.61 cm2
WEIGHTICAEL: 1823.65 [oz_av]

## 2016-08-09 LAB — MAGNESIUM: Magnesium: 2.2 mg/dL (ref 1.7–2.4)

## 2016-08-09 LAB — POTASSIUM: POTASSIUM: 3.7 mmol/L (ref 3.5–5.1)

## 2016-08-09 MED ORDER — POTASSIUM CHLORIDE ER 20 MEQ PO TBCR: 10.0000 meq | EXTENDED_RELEASE_TABLET | Freq: Two times a day (BID) | ORAL | 0 refills | Status: DC

## 2016-08-09 MED ORDER — LOSARTAN POTASSIUM 50 MG PO TABS
25.0000 mg | ORAL_TABLET | Freq: Every day | ORAL | Status: DC
  Administered 2016-08-09: 25 mg via ORAL
  Filled 2016-08-09: qty 1

## 2016-08-09 MED ORDER — POTASSIUM CHLORIDE CRYS ER 20 MEQ PO TBCR
60.0000 meq | EXTENDED_RELEASE_TABLET | Freq: Once | ORAL | Status: AC
  Administered 2016-08-09: 60 meq via ORAL
  Filled 2016-08-09: qty 3

## 2016-08-09 NOTE — Progress Notes (Signed)
Patient refused tele. MD made aware. Will continue to monitor.

## 2016-08-09 NOTE — H&P (Signed)
Date: 08/09/2016               Patient Name:  Karen Larson MRN: [id number]  DOB: 07-27-1940 Age / Sex: 76 y.o., female   PCP: System, Pcp Not In              Medical Service: Internal Medicine Teaching Service              Attending Physician: Dr. Mikey Bussing, Marthenia Rolling, DO    First Contact: Macarthur Critchley, MS3 Pager: [phone]  Second Contact: Dr. Obie Dredge Pager: [phone]  Third Contact Dr. Earlene Plater Pager: [phone]       After Hours (After 5p/  First Contact Pager: [phone]  weekends / holidays): Second Contact Pager: [phone]   Chief Complaint: "Doctor said come in because labs are low"  History of Present Illness: Karen Larson is a 76 y/o woman with PMH of hypokalemia, squamous cell carcinoma, COPD, congestive heart failure in setting of aortic stenosis who presents to ED at the advise of her physician due to hypokalemia. She states that she saw a new PCP yesterday, who drew blood for various labs. Her physician called her last night around 9 pm and advised the pt. To go to the ED due to "low labs." The pt. Waited until this AM to come to the ED. She states she has no other symptoms that prompted her.  Upon further questioning, the patient endorses feeling more tired for the past 3 months. She states that during this time she has had less of an appetite and has lost approximately 10 lbs. She states that she used to be able to mow her entire lawn, but that recently she has had to stop much earlier because she would get out of breath. She denies chest pain or passing out. She says she has been sleeping in later than usual for the past few months, but denies using any more than 1 pillow. Pt. States that she has not been taking her K+ supplement for a few months because she ran out.   The pt's sister arrived shortly after and more discussion occurred. The pt's sister states that the pt. Has been drinking 2-3 Monster energy drinks per day and has become increasingly jittery as well as "abnormal behavior  such as driving around more frequently when she used to be a "home body.". Furthermore, the sister and pt. Recount that the pt. Has developed both urinary and bowel incontinence. The sister believes the constellation of behavior is due to increased energy drinks.  Meds:          Current Facility-Administered Medications  Medication Dose Route Frequency Provider Last Rate Last Dose  . potassium chloride 10 mEq in 100 mL IVPB  10 mEq Intravenous Q1 Hr x 4 Law, Alexandra M, PA-C   Stopped at 08/08/16 1251         Current Outpatient Prescriptions  Medication Sig Dispense Refill  . aspirin EC 81 MG tablet Take 81 mg by mouth every other day.    . budesonide-formoterol (SYMBICORT) 160-4.5 MCG/ACT inhaler Take 1 puff by mouth daily.    . furosemide (LASIX) 20 MG tablet Take 20 mg by mouth daily.    Marland Kitchen ibuprofen (ADVIL,MOTRIN) 800 MG tablet Take 800 mg by mouth 2 (two) times daily.    Marland Kitchen losartan (COZAAR) 25 MG tablet Take 25 mg by mouth daily.    . metoprolol succinate (TOPROL-XL) 25 MG 24 hr tablet Take 25 mg by mouth daily.  1  .  potassium chloride (KLOR-CON M10) 10 MEQ tablet Take 10 mEq by mouth 2 (two) times daily.      Allergies:      Allergies as of 08/08/2016 - Review Complete 08/08/2016  Allergen Reaction Noted  . Tiotropium bromide monohydrate Nausea Only 04/24/2016  . Doxycycline Nausea Only 03/11/2015  . Levaquin  [levofloxacin] Nausea Only 03/11/2015   Family history:  -Breast cancer: sister -COPD: father (allegedly non-smoker)  Social History:  -Started smoking 1 pk/day at age 49. She quit then restarted. Approximately 50 pk/yrs -No alcohol use -No illicit drug use  Review of Systems: General: Endorses fatigue, weight loss; denies subjective fevers, night sweats Skin: Denies nail changes, rashes HEENT: Denies nausea, vomiting, vision changes, hearing changes CV: Denies chest pain Respiratory: Endorses SOB upon exertion, increased dry cough;  denies sputum production GI: Endorses decreased appetite; denies nausea, vomiting, change in bowel frequency or stool size,color, consistency. Denies hematochezia or melena. Denies abdominal pain  GU: Denies change in urine frequency, gross hematuria, dysuria  Physical Exam: Blood pressure 105/63, pulse 84, temperature 97.5 F (36.4 C), temperature source Oral, resp. rate (!) 25, height 5\' 6"  (1.676 m), weight 51.7 kg (114 lb), SpO2 96 %.  General: NAD, comfortable Skin, nails: Face, arms, and legs tan on sun-exposed areas; actinic keratoses on face. No hyperpigmentation. No nail clubbing. CV: RRR, systolic murmur, no peripheral edema Pulm: Clear to auscultation bilaterally. No wheezes, crackles Abdomen: Soft, nontender. No hepatosplenomegaly Neuro: CN 3-12 intact   Lab results: Basic Metabolic Panel:  Recent Labs (last 2 labs)    Recent Labs  08/08/16 1009 08/08/16 1032  NA 141  --   K <2.0*  --   CL 94*  --   CO2 36*  --   GLUCOSE 106*  --   BUN 5*  --   CREATININE 0.95  --   CALCIUM 8.9  --   MG  --  1.8     Liver Function Tests:  Recent Labs (last 2 labs)    Recent Labs  08/08/16 1009  AST 24  ALT 14  ALKPHOS 88  BILITOT 1.1  PROT 6.8  ALBUMIN 3.7     CBC:  Recent Labs (last 2 labs)    Recent Labs  08/08/16 1009  WBC 9.3  HGB 13.1  HCT 40.6  MCV 91.9  PLT 319      Imaging results:   Imaging Results (Last 48 hours)  Dg Chest 2 View  Result Date: 08/08/2016 CLINICAL DATA:  Shortness of breath, worsening for the past few months. EXAM: CHEST  2 VIEW COMPARISON:  None. FINDINGS: Hyperinflation. Patient has history of smoking. There is no edema, consolidation, effusion, or pneumothorax. Normal heart size and mediastinal contours. Remote distal right clavicle fracture with hypertrophic healing. IMPRESSION: 1. No acute finding. 2. Hyperinflation suggesting COPD in this smoker. Electronically Signed   By: Marnee Spring M.D.   On:  08/08/2016 11:49     ECG: Sinus rhythm, multiple ventricular premature complexes, RBBB, anteroseptal infarct (age indeterminate)  Assessment & Plan by Problem: Active Problems:   Hypokalemia  Karen Larson is a 76 y/o woman with PMH of hypokalemia, squamous cell carcinoma, COPD, congestive heart failure in setting of aortic stenosis who presents to ED with asymptomatic hypokalemia.   Hypokalemia: Top of Ddx includes persistent vomiting, loop diuretic, diarrhea. In the setting of hypomagnesemia, hypochloremia loop diuretic adverse effect vs. Abuse is high on differential. Pt. States she has not been taking her prescribed K+ supplement for  the past few months because she ran out. Furosemide in setting of lack of K+ supplementation is likely cause of her hypokalemia.   -Klor-Con 750 mg (10 mEq), 2 tablets, q6h for 24 hours - 20 mEq/hr in NS IV for 3 hours -Trend BMP q4h -Telemetry  Hypomagnesemia: Ddx. Includes loop diuretic, diarrhea, other meds (eg. Aminoglycosides, long term PPI). Magnesium 1.8 (low normal). Asymptomatic. Pt. Denies diarrhea. Most likely due to furosemide.   -HOLD furosemide  Shortness of breath with exertion: Pt. Complains of 3 months worsening SOB, in setting of COPD, CHF, and aortic stenosis. Pt. Saturating >96% on RA, clinically appears in no acute distress without difficulty breathing, with normal respiratory effort. Unlikely to be COPD or CHF exacerbation.  -Trend clinically -Outpatient meds. For COPD  -Consider 6 minute walk test in outpt. Setting for reassessment of O2 requirements  Decreased appetite, weight loss: Pt. Endorses anorexia and weight loss over the past 3 months. Sister states pt. Has been drinking a lot of neregy drinks (2-3 Monster energy drinks). Pt. Weighed 51.3 kg 05/2015, her same weight on admission today. Objectively, she has not lost weight. In 2013, it appears she had a wellness check up that included a colonoscopy---but results  seem unavailable. She is not anemic. Pt. Denies hematochezia, change in bowel habits. Colorectal cancer low on differential. Normal total protein, albumin-->prolonged starvation low on differential as well. Lung cancer low given normal CXR, no nail clubbing.   -Find colonoscopy results  COPD: Followed by Dr. Marliss Coots. Last PFTs 04/24/2016: FEV1 56%, FVC 66%, FEV1/FVC 59%, DLCO 49% Pt. Does not appear to be having exacerbation given normal vitals, and clinically stable in no distress. CXR sig. Only for unchanged mild hyperinflation.   -continue home Symbicort -Outpatient management  CHF in setting of aortic stenosis: Followed by Dr. Juliane Lack. No peripheral swelling, pulmonary edema on CXR, clear lungs on exam. EF 30% per 05/04/2015 cardiology note. Last echo significant for AS and mitral regurgitation. Less likely to be CHF exacerbation, but must be investigated  -ECG -Echo-->she is due for one per Dr. 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  The care of the patient was discussed with Dr. Obie Dredge and the assessment and plan was formulated with their assistance.  Please see their note for official documentation of the patient encounter.   Signed: Cyndra Numbers, Medical Student 08/08/2016, 1:49 PM

## 2016-08-09 NOTE — Discharge Summary (Signed)
Name: Karen Larson MRN: [id number] DOB: 1940-11-17 76 y.o. PCP: System, Pcp Not In  Date of Admission: 08/08/2016 10:09 AM Date of Discharge: 08/09/2016 Attending Physician: Gust Rung, DO  Discharge Diagnosis: 1. Hypokalemia   Aortic stenosis   CHF (congestive heart failure) (HCC)   Dyspnea on exertion   Discharge Medications: Allergies as of 08/09/2016      Reactions   Tiotropium Bromide Monohydrate Nausea Only   Doxycycline Nausea Only   Levaquin  [levofloxacin] Nausea Only      Medication List    STOP taking these medications   KLOR-CON M10 10 MEQ tablet Generic drug:  potassium chloride     TAKE these medications   aspirin EC 81 MG tablet Take 81 mg by mouth every other day.   furosemide 20 MG tablet Commonly known as:  LASIX Take 20 mg by mouth daily.   ibuprofen 800 MG tablet Commonly known as:  ADVIL,MOTRIN Take 800 mg by mouth 2 (two) times daily.   losartan 25 MG tablet Commonly known as:  COZAAR Take 25 mg by mouth daily.   metoprolol succinate 25 MG 24 hr tablet Commonly known as:  TOPROL-XL Take 25 mg by mouth daily.   Potassium Chloride ER 20 MEQ Tbcr Take 10 mEq by mouth 2 (two) times daily.   SYMBICORT 160-4.5 MCG/ACT inhaler Generic drug:  budesonide-formoterol Take 1 puff by mouth daily.       Disposition and follow-up:   Karen Larson was discharged from [facility] in Stable condition.  At the hospital follow up visit please address:  1.  Hypokalemia - reassess potassium level and supplementation.  Severe aortic stenosis- ensure that she has followed up with cardiology.  2.  Labs / imaging needed at time of follow-up: BMP   3.  Pending labs/ test needing follow-up: none   Follow-up Appointments: Follow-up Information    Preli, Maryella Shivers, MD. Schedule an appointment as soon as possible for a visit in 1 week(s).   Specialty:  Cardiology Contact information: [address] [place]  [address] [phone]        Roger Kill, PA-C. Schedule an appointment as soon as possible for a visit in 3 day(s).   Specialty:  Physician Assistant Contact information: [address] [place] [address] [phone]           Hospital Course by problem list:   Hypokalemia Karen Larson is a 76 yo woman with PMH systolic CHF, moderate aortic stenosis, and COPD who went to her PCP the day prior presentation for routine lab work and was called overnight and alerted to present to the ED for a lab abnormality. In the ED she reported that she was not feeling out of the ordinary and was found to have a potassium below the limit of detectable (<2). She described that she had ran out of potassium supplementation three months prior to presentation but had continued to take her lasix prescription. EKG showed no EKG changes associated with hypokalemia. Potassium responded well to oral repletion and improved to 3.7 at the time of discharge. At discharge her prescription was increased to K-dur 20 meq BID and told to follow up with her PCP within a week.      Aortic stenosis   CHF (congestive heart failure) (HCC)   Dyspnea on exertion Described worsening of her baseline DOE (difficulty mowing her lawn without stopping to rest).We repeated an echo which revealed improvement in her  EF 45-50% (was 25-30% on last echo in 2015), with new grade 1 diastolic dysfuntion and severe aortic stenosis (peak gradient 66 mg) with mild regurgitation. We notified her of these changes and told her how important it is for her to follow up with her cardiologist for this.     Protein-calorie malnutrition, severe Provided ensure supplementation through the admission.      Chronic obstructive pulmonary disease (HCC) No description of exacerbation or wheezing on exam.   Discharge Vitals:   BP 129/73 (BP Location: Right Arm)   Pulse 89   Temp 98.7 F (37.1 C) (Oral)   Resp 18   Ht 5\' 6"  (1.676 m)    Wt 113 lb 15.7 oz (51.7 kg)   SpO2 97%   BMI 18.40 kg/m   Pertinent Labs, Studies, and Procedures:   Echocardiogram 08/09/2016  Study Conclusions  - Left ventricle: The cavity size was mildly dilated. Wall   thickness was normal. Systolic function was mildly reduced. The   estimated ejection fraction was in the range of 45% to 50%.   Anterior and basal anteroseptal hypokinesis. Doppler parameters   are consistent with abnormal left ventricular relaxation (grade 1   diastolic dysfunction). - Aortic valve: Probably trileaflet; severely calcified leaflets.   There was severe stenosis. There was mild regurgitation. Mean   gradient (S): 38 mm Hg. Peak gradient (S): 66 mm Hg. Valve area   (VTI): 0.61 cm^2. - Mitral valve: Mildly calcified annulus. There was mild   regurgitation. Valve area by pressure half-time: 2.14 cm^2. - Right ventricle: The cavity size was normal. Systolic function   was normal. - Tricuspid valve: There was moderate regurgitation. Peak RV-RA   gradient (S): 43 mm Hg. - Pulmonary arteries: PA peak pressure: 46 mm Hg (S). - Inferior vena cava: The vessel was normal in size. The   respirophasic diameter changes were in the normal range (>= 50%),   consistent with normal central venous pressure.  Impressions:  - Mildly dilated LV with EF 45-50%. Wall motion abnormalities as   noted above. Normal RV size and systolic function. Mild pulmonary   hypertension. Severe aortic stenosis.  Discharge Instructions: Discharge Instructions    Call MD for:  difficulty breathing, headache or visual disturbances    Complete by:  As directed    Call MD for:  extreme fatigue    Complete by:  As directed    Call MD for:  persistant dizziness or light-headedness    Complete by:  As directed    Diet - low sodium heart healthy    Complete by:  As directed    Diet - low sodium heart healthy    Complete by:  As directed    Increase activity slowly    Complete by:  As directed     Increase activity slowly    Complete by:  As directed       Signed: Eulah Pont, MD 08/12/2016, 7:07 PM   Pager: [phone]

## 2016-08-09 NOTE — Progress Notes (Signed)
  Echocardiogram 2D Echocardiogram has been performed.  Karen Larson M 08/09/2016, 10:19 AM

## 2016-08-09 NOTE — Progress Notes (Signed)
Subjective: No acute overnight events. Pt. Has no complaints. Denies chest pain, abdominal pain, nausea, vomiting.   Objective: Vital signs in last 24 hours: Vitals:   08/08/16 2041 08/08/16 2147 08/09/16 0502 08/09/16 0829  BP:  (!) 132/54 (!) 145/70   Pulse:  73 74   Resp:  18 18   Temp:  97.8 F (36.6 C) 97.8 F (36.6 C)   TempSrc:  Oral Oral   SpO2: 97% 99% 100% 95%  Weight:      Height:       Physical exam: General: NAD, comfortable Skin, nails: Face, arms, and legs tan on sun-exposed areas; actinic keratoses on face. No hyperpigmentation. No nail clubbing. CV: RRR, systolic murmur, no peripheral edema Pulm: Clear to auscultation bilaterally. No wheezes, crackles Abdomen: Soft, nontender. No hepatosplenomegaly Neuro: CN 3-12 intact   Lab Results: Basic Metabolic Panel:  Recent Labs Lab 08/08/16 1032  08/09/16 0413 08/09/16 1126  NA  --   < > 145 144  K  --   < > 2.8* 3.1*  CL  --   < > 104 105  CO2  --   < > 32 31  GLUCOSE  --   < > 94 111*  BUN  --   < > 9 7  CREATININE  --   < > 0.94 0.95  CALCIUM  --   < > 8.6* 8.6*  MG 1.8  --  2.2  --   < > = values in this interval not displayed. Liver Function Tests:  Recent Labs Lab 08/08/16 1009  AST 24  ALT 14  ALKPHOS 88  BILITOT 1.1  PROT 6.8  ALBUMIN 3.7   CBC:  Recent Labs Lab 08/08/16 1009  WBC 9.3  HGB 13.1  HCT 40.6  MCV 91.9  PLT 319   Studies/Results: Dg Chest 2 View  Result Date: 08/08/2016 CLINICAL DATA:  Shortness of breath, worsening for the past few months. EXAM: CHEST  2 VIEW COMPARISON:  None. FINDINGS: Hyperinflation. Patient has history of smoking. There is no edema, consolidation, effusion, or pneumothorax. Normal heart size and mediastinal contours. Remote distal right clavicle fracture with hypertrophic healing. IMPRESSION: 1. No acute finding. 2. Hyperinflation suggesting COPD in this smoker. Electronically Signed   By: Marnee Spring M.D.   On: 08/08/2016 11:49    Medications:  Scheduled Meds: . aspirin EC  81 mg Oral QODAY  . enoxaparin (LOVENOX) injection  40 mg Subcutaneous Q24H  . feeding supplement (ENSURE ENLIVE)  237 mL Oral BID BM  . losartan  25 mg Oral Daily  . mouth rinse  15 mL Mouth Rinse BID  . mometasone-formoterol  2 puff Inhalation BID  . potassium chloride  60 mEq Oral Once  . sodium chloride flush  3 mL Intravenous Q12H   Continuous Infusions: PRN Meds:.acetaminophen **OR** acetaminophen, polyethylene glycol Assessment/Plan: Principal Problem:   Hypokalemia Active Problems:   Hypokalemia due to loss of potassium   Aortic stenosis   CHF (congestive heart failure) (HCC)   Chronic obstructive pulmonary disease (HCC)   Dyspnea on exertion   Hypokalemia: Pt. Continues to be asymptomatic. Positive response to potassium supplementation but still inadequate this AM, with K+ at 2.8. 60 mEq K+ ordered this AM, repeat BMP shows K+ at 3.1.   -60 mEQ K+ PO -Repeat BMP -D/C today with refill of Klor-Con -Outpt. F/u  SOB w/ exertion: stable  -Outpt. F/u  Decreased appetite: Pt. Has been drinking multiple Monster Energy drinks, with high caffeine  content. Low concern for malignancy.  -Advise on intake of energy drinks  COPD: Currently stable.   -Consider 6 minute walk test in outpt. Setting to re-evaluate O2 requirements  CHF in setting of aortic stenosis:   -Outpt. Echo.  -Outpt. Medications  -Furosemide 10mg  (half home dose) until f/u  This is a Psychologist, occupational Note.  The care of the patient was discussed with Dr. Obie Dredge and the assessment and plan formulated with their assistance.  Please see their attached note for official documentation of the daily encounter.   LOS: 0 days   Cyndra Numbers, Medical Student 08/09/2016, 1:00 PM

## 2016-08-09 NOTE — Progress Notes (Signed)
  Echocardiogram 2D Echocardiogram has been performed.  Karen Larson M 08/09/2016, 10:28 AM

## 2016-08-09 NOTE — Discharge Instructions (Signed)
Ms. Karen Larson,   It was a pleasure working with you. Im glad you're feeling well. Your abnormal lab value (potassium) has responded well to the potassium supplementation. The ultrasound of your heart (echocardiogram) did reveal that your heart valve is worse than it was before, this may be contributing to your shortness of breath with exertion.  SCHEDULE A FOLLOW UP APPOINTMENT WITH YOUR CARDIOLOGIST AND NEW DOCTOR FOR NEXT WEEK.   Unit you make it to your primary care doctors visit,  START taking potassium 20 meq twice daily ( you were previously taking 10 meq daily)   If you have any questions about your hospitalization, please call the [facility] internal medicine center [phone].

## 2016-08-09 NOTE — Care Management Obs Status (Signed)
MEDICARE OBSERVATION STATUS NOTIFICATION   Patient Details  Name: Karen Larson MRN: [id number] Date of Birth: Nov 28, 1940   Medicare Observation Status Notification Given:  Yes    Epifanio Lesches, RN 08/09/2016, 2:25 PM

## 2016-08-09 NOTE — Progress Notes (Signed)
Patient was discharged home by MD order; discharged instructions review and give to patient with care notes; IV DIC;  patient will be escorted to the car by nurse tech via wheelchair.  

## 2016-08-09 NOTE — Progress Notes (Signed)
   Subjective: Feeling fine today. She has no concerns or complaints and is eager to leave the hospital.   Objective:  Vital signs in last 24 hours: Vitals:   08/08/16 2041 08/08/16 2147 08/09/16 0502 08/09/16 0829  BP:  (!) 132/54 (!) 145/70   Pulse:  73 74   Resp:  18 18   Temp:  97.8 F (36.6 C) 97.8 F (36.6 C)   TempSrc:  Oral Oral   SpO2: 97% 99% 100% 95%  Weight:      Height:       Physical Exam  Constitutional: She is oriented to person, place, and time. No distress.  HENT:  Head: Normocephalic and atraumatic.  Cardiovascular: Normal rate and regular rhythm.   No murmur heard. Pulmonary/Chest: Effort normal and breath sounds normal. No respiratory distress. She has no wheezes.  Crackles over right lower lung field   Abdominal: Soft. Bowel sounds are normal. She exhibits no distension. There is no tenderness.  Neurological: She is alert and oriented to person, place, and time.  Skin: Skin is warm and dry. She is not diaphoretic.   Assessment/Plan:  Principal Problem:   Hypokalemia Active Problems:   Hypokalemia due to loss of potassium   Aortic stenosis   CHF (congestive heart failure) (HCC)   Chronic obstructive pulmonary disease (HCC)   Dyspnea on exertion  Hypokalemia  Potassium has responded well and improved with repletion overnight. Hypokalemia was most likely related to her running out of potassium supplementation and continuing to take lasix. At discharge I will decrease her lasix and increase her potassium supplementation and have her schedule close follow up with her PCP.  - follow up afternoon K, if this is appropriate for her supplementation she will be discharge.   Chronic systolic CHF  Hx of aortic stenosis  Described new worsening of dyspnea on exertion. We repeated an echo today which revealed improvement in her EF 45-50% (was 25-30% on last echo in 2015), with new grade 1 diastolic dysfuntion and severe aortic stenosis (peak gradient 66 mg) with  mild regurgitation. She is due for cardiology follow up at [facility] and has agreed to do so after discharge.  - Cardiology follow up outpatient    Dispo: Anticipated discharge today  Karen Pont, MD 08/09/2016, 2:51 PM Pager: [phone]

## 2016-08-09 NOTE — Progress Notes (Signed)
Initial Nutrition Assessment  DOCUMENTATION CODES:   Severe malnutrition in context of chronic illness, Underweight  INTERVENTION:   -Ensure Enlive po BID, each supplement provides 350 kcal and 20 grams of protein  NUTRITION DIAGNOSIS:   Malnutrition (Severe) related to chronic illness (COPD) as evidenced by moderate depletion of body fat, severe depletion of body fat, moderate depletions of muscle mass, severe depletion of muscle mass.  GOAL:   Patient will meet greater than or equal to 90% of their needs  MONITOR:   PO intake, Supplement acceptance, Labs, Weight trends, Skin, I & O's  REASON FOR ASSESSMENT:   Malnutrition Screening Tool    ASSESSMENT:   Karen Larson is a 76 y/o woman with PMH of hypokalemia, squamous cell carcinoma, COPD, congestive heart failure in setting of aortic stenosis who presents to ED with asymptomatic hypokalemia.   Pt admitted with hypokalemia.   Spoke with pt, who reports having a poor appetite over the past 3 months ("I just didn't have one"). Pt reports that she generally consumes 3 meals per day and intake has improved greatly since being in the hospital- she states she ate 100% of her breakfast this AM.   Pt shares UBW is around 120# and endorses a 5-10# wt loss over the past 3 months, however, this is not consistent with wt hx. Per Care Everywhere records, wt has ranged from 11-117# over the past year. No wt changes significant for time frame.   Nutrition-Focused physical exam completed. Findings are moderate (thoarcic region) to severe (upper arm) fat depletion, moderate (dorsal hand) to severe (clavicle, acromion, scapula, patella, calf, thigh) muscle depletion, and mild edema.   Discussed importance of good PO intake, especially protein intake, to promote healing. Pt enjoys Ensure supplements and is amenable to continue both at home and during hospitalization.   Labs reviewed: K: 3.1.  Diet Order:  Diet regular Room service  appropriate? Yes; Fluid consistency: Thin  Skin:  Reviewed, no issues  Last BM:  08/08/16  Height:   Ht Readings from Last 1 Encounters:  08/08/16 5\' 6"  (1.676 m)    Weight:   Wt Readings from Last 1 Encounters:  08/08/16 113 lb 15.7 oz (51.7 kg)    Ideal Body Weight:  59.2 kg  BMI:  Body mass index is 18.4 kg/m.  Estimated Nutritional Needs:   Kcal:  1600-1800  Protein:  80-95 grams  Fluid:  1.6-1.8 L  EDUCATION NEEDS:   Education needs addressed  Hattie Aguinaldo A. Mayford Knife, RD, LDN, CDE Pager: [phone] After hours Pager: [phone]

## 2016-08-09 NOTE — H&P (Signed)
Internal Medicine Attending Admission Note  I saw and evaluated the patient. I reviewed the resident's note and I agree with the resident's findings and plan as documented in the resident's note.  Assessment & Plan by Problem:   Hypokalemia - 2/2 ongoing lasix use without potassium supplementation (patient has not filled Rx in several months) -  No EKG changes ass with hypokalemia.  Patient asymptomatic, no events on tele.  With replacement patient has improved to 3.1 this afternoon.  I think we can give her another dose of oral potassium and discharge her with PCP follow up.  She will need to continue outpatient potassium to replete stores.    Dyspnea on exertion   Aortic stenosis   Chronic systeolicCHF (congestive heart failure) (HCC)   Chronic obstructive pulmonary disease (HCC) - She reports DOE, this may be multifactorial.  However she has no wheezing on exam, normal pulse ox while resting and clear CXR. Thus this makes me more concerned about her valvular disease.  She last followed up with her Cardiologist in March 2017 and was due for repeat echo in 1 year.  We decided to go ahead and repeat echo today, this echo parameters reveal severe AS however she has had improved in LVEF.  We have stressed to her the importance of follow up with cardiology given this finding as she may be a candidate for TAVR. - Hold lasix for now, may continue her other home medications   Chief Complaint(s):abnormal labs  History - key components related to admission:Briefly Karen Larson is a 76 year old female with past medical history of systolic congestive heart failure, moderate aortic stenosis, COPD. The day prior to presentation she went to the PCP office to obtain routine lab work. She reports that she was called that night to come in because of low labs. She presented to the ED with repeat lab work revealed that she was severely hypokalemic. She reports that she did not notice this she was feeling in her  normal state of health. Overall she denies any nausea vomiting or diarrhea. She reports that she has been compliant with her medications however I'm going to her medication list she realizes that she has not taken her potassium supplements and multiple months. Otherwise review of systems is notable for worsening dyspnea on exertion, without any history of syncope or angina. She was admitted overnight for observation and potassium replacement. She reports that she feels very well this morning and is inquiring about going home.  Lab results: Reviewed in Epic  Physical Exam - key components related to admission: General: resting in bed, thin HEENT: EOMI, no scleral icterus Cardiac: RRR, systolic murmur Pulm: clear to auscultation bilaterally, moving normal volumes of air, no wheezing Abd: soft, nontender, nondistended, BS present Ext: warm and well perfused, no pedal edema Neuro: alert and oriented X3  Vitals:   08/08/16 2041 08/08/16 2147 08/09/16 0502 08/09/16 0829  BP:  (!) 132/54 (!) 145/70   Pulse:  73 74   Resp:  18 18   Temp:  97.8 F (36.6 C) 97.8 F (36.6 C)   TempSrc:  Oral Oral   SpO2: 97% 99% 100% 95%  Weight:      Height:

## 2016-10-08 ENCOUNTER — Other Ambulatory Visit: Payer: Self-pay | Admitting: Internal Medicine

## 2016-10-17 ENCOUNTER — Other Ambulatory Visit: Payer: Self-pay | Admitting: Internal Medicine

## 2016-12-22 ENCOUNTER — Emergency Department (HOSPITAL_COMMUNITY): Payer: Medicare Other

## 2016-12-22 ENCOUNTER — Encounter (HOSPITAL_COMMUNITY): Payer: Self-pay | Admitting: Oncology

## 2016-12-22 ENCOUNTER — Emergency Department (HOSPITAL_COMMUNITY)
Admission: EM | Admit: 2016-12-22 | Discharge: 2016-12-22 | Disposition: A | Discharge status: Other Acute Inpatient Hospital | Payer: Medicare Other | Attending: Emergency Medicine | Admitting: Emergency Medicine

## 2016-12-22 DIAGNOSIS — Z950 Presence of cardiac pacemaker: Secondary | ICD-10-CM | POA: Diagnosis not present

## 2016-12-22 DIAGNOSIS — F17200 Nicotine dependence, unspecified, uncomplicated: Secondary | ICD-10-CM | POA: Insufficient documentation

## 2016-12-22 DIAGNOSIS — J449 Chronic obstructive pulmonary disease, unspecified: Secondary | ICD-10-CM | POA: Insufficient documentation

## 2016-12-22 DIAGNOSIS — Z79899 Other long term (current) drug therapy: Secondary | ICD-10-CM | POA: Diagnosis not present

## 2016-12-22 DIAGNOSIS — I509 Heart failure, unspecified: Secondary | ICD-10-CM | POA: Diagnosis not present

## 2016-12-22 DIAGNOSIS — J939 Pneumothorax, unspecified: Secondary | ICD-10-CM

## 2016-12-22 DIAGNOSIS — Z7982 Long term (current) use of aspirin: Secondary | ICD-10-CM | POA: Insufficient documentation

## 2016-12-22 DIAGNOSIS — R0602 Shortness of breath: Secondary | ICD-10-CM | POA: Diagnosis present

## 2016-12-22 DIAGNOSIS — I110 Hypertensive heart disease with heart failure: Secondary | ICD-10-CM | POA: Diagnosis not present

## 2016-12-22 DIAGNOSIS — J9311 Primary spontaneous pneumothorax: Secondary | ICD-10-CM | POA: Diagnosis not present

## 2016-12-22 HISTORY — DX: Essential (primary) hypertension: I10

## 2016-12-22 HISTORY — DX: Nonrheumatic aortic (valve) stenosis: I35.0

## 2016-12-22 HISTORY — DX: Heart failure, unspecified: I50.9

## 2016-12-22 HISTORY — DX: Unspecified osteoarthritis, unspecified site: M19.90

## 2016-12-22 HISTORY — DX: Cardiomyopathy, unspecified: I42.9

## 2016-12-22 HISTORY — DX: Chronic obstructive pulmonary disease, unspecified: J44.9

## 2016-12-22 LAB — CBC WITH DIFFERENTIAL/PLATELET
Basophils Absolute: 0 10*3/uL (ref 0.0–0.1)
Basophils Relative: 0 %
EOS ABS: 0.1 10*3/uL (ref 0.0–0.7)
EOS PCT: 0 %
HCT: 28 % — ABNORMAL LOW (ref 36.0–46.0)
Hemoglobin: 8.8 g/dL — ABNORMAL LOW (ref 12.0–15.0)
LYMPHS ABS: 1.1 10*3/uL (ref 0.7–4.0)
Lymphocytes Relative: 5 %
MCH: 29.4 pg (ref 26.0–34.0)
MCHC: 31.4 g/dL (ref 30.0–36.0)
MCV: 93.6 fL (ref 78.0–100.0)
MONOS PCT: 8 %
Monocytes Absolute: 1.8 10*3/uL — ABNORMAL HIGH (ref 0.1–1.0)
Neutro Abs: 19.8 10*3/uL — ABNORMAL HIGH (ref 1.7–7.7)
Neutrophils Relative %: 87 %
Platelets: 436 10*3/uL — ABNORMAL HIGH (ref 150–400)
RBC: 2.99 MIL/uL — ABNORMAL LOW (ref 3.87–5.11)
RDW: 14.9 % (ref 11.5–15.5)
WBC: 22.7 10*3/uL — AB (ref 4.0–10.5)

## 2016-12-22 LAB — BASIC METABOLIC PANEL
Anion gap: 8 (ref 5–15)
BUN: 18 mg/dL (ref 6–20)
CALCIUM: 8.4 mg/dL — AB (ref 8.9–10.3)
CHLORIDE: 106 mmol/L (ref 101–111)
CO2: 23 mmol/L (ref 22–32)
CREATININE: 0.88 mg/dL (ref 0.44–1.00)
GFR calc Af Amer: >60 mL/min (ref >60)
GFR calc non Af Amer: >60 mL/min (ref >60)
Glucose, Bld: 109 mg/dL — ABNORMAL HIGH (ref 65–99)
Potassium: 4.5 mmol/L (ref 3.5–5.1)
SODIUM: 137 mmol/L (ref 135–145)

## 2016-12-22 LAB — PROTIME-INR
INR: 1.08
PROTHROMBIN TIME: 14 s (ref 11.4–15.2)

## 2016-12-22 MED ORDER — LIDOCAINE HCL (PF) 1 % IJ SOLN
10.0000 mL | Freq: Once | INTRAMUSCULAR | Status: AC
  Administered 2016-12-22: 10 mL

## 2016-12-22 MED ORDER — MORPHINE SULFATE (PF) 4 MG/ML IV SOLN
INTRAVENOUS | Status: AC
  Filled 2016-12-22: qty 1

## 2016-12-22 MED ORDER — MORPHINE SULFATE (PF) 4 MG/ML IV SOLN
2.0000 mg | Freq: Once | INTRAVENOUS | Status: AC
  Administered 2016-12-22: 2 mg via INTRAVENOUS

## 2016-12-22 MED ORDER — LIDOCAINE HCL (PF) 1 % IJ SOLN
INTRAMUSCULAR | Status: AC
  Filled 2016-12-22: qty 30

## 2016-12-22 MED ORDER — SODIUM CHLORIDE 0.9 % IV BOLUS (SEPSIS)
500.0000 mL | Freq: Once | INTRAVENOUS | Status: AC
  Administered 2016-12-22: 500 mL via INTRAVENOUS

## 2016-12-22 NOTE — Progress Notes (Signed)
Patient ID: Karen Larson, female   DOB: 01/27/1941, 76 y.o.   MRN: [id number] CT Surgery  CXR reviewed. Chest tube in good position with complete resolution of the pneumothorax. ER has made arrangements for transfer to [facility] where she had her surgery.

## 2016-12-22 NOTE — ED Notes (Signed)
Spoke with Clinical cytogeneticist at [facility], reports waiting for bed assignment. Will call with further details when available.

## 2016-12-22 NOTE — ED Notes (Signed)
Pt given orange juice. [facility] is on the way.

## 2016-12-22 NOTE — ED Triage Notes (Signed)
Pt bib [facility] from [facility] d/t right sided pneumothorax.  Pt had chest x-ray done at 10/19 @ 2300 revealing 75% collapse of right lung.  Per EMS crackles noted on the left side.  Per EMS pt was given chest x-ray d/t ongoing cough.

## 2016-12-22 NOTE — ED Notes (Signed)
Report given to Verlon Au, RN at [facility] 6105 room #.

## 2016-12-22 NOTE — ED Notes (Signed)
Secretary notifying RN bed available at [facility]. Call report to [phone]. Still pending xray verification, have called radiology x 2 and they will be here soon.

## 2016-12-22 NOTE — ED Notes (Signed)
Dr. Laneta Simmers at bedside preparing for right sided chest tube placement. Consent obtained.

## 2016-12-22 NOTE — Progress Notes (Signed)
Patient ID: Karen Larson, female   DOB: 1941/02/04, 76 y.o.   MRN: [id number]  CT Surgery  Patient is a 76 year old smoker with severe COPD who underwent TAO TAVR a few weeks ago at [facility] by Dr. Para March. She also required a PPM postop. She was discharged to SNF. Her sister reports coughing since surgery that has been forceful at times. Overnight she developed worsening shortness of breath and was brought to ER where CXR shows large right pneumothorax. Sats have been ok but she has a rattling cough and no BS on the right. Plan chest tube insertion. Informed consent obtained from the patient. Risks including but not limited to bleeding, lung injury, infection, persistent air leak, recurrent ptx discussed with patient and her sister at bedside. She agrees to proceed.

## 2016-12-22 NOTE — ED Provider Notes (Signed)
[facility] Provider Note   CSN: [id number] Arrival date & time: 12/22/16  0503     History   Chief Complaint Chief Complaint  Patient presents with  . Chest Injury    HPI Karen Larson is a 76 y.o. female.  The history is provided by the patient.  Shortness of Breath  This is a new problem. The problem occurs frequently.The current episode started 6 to 12 hours ago. The problem has been gradually worsening. Associated symptoms include cough. Pertinent negatives include no fever, no chest pain and no abdominal pain. Associated medical issues include COPD.  Patient with h/o COPD H/o aortic stenosis [facility] She also had recent pacemaker placement She is staying in nursing facility Apparently pt had increased SOB last night An CXR was performed that revealed a right sided PTX No falls were reported She has been in facility since 10/13 without complications  Pt reports SOB at this time She denies any new CP  Past Medical History:  Diagnosis Date  . Aortic valve stenosis   . Arthritis   . Cardiomyopathy (HCC)   . CHF (congestive heart failure) (HCC)   . COPD (chronic obstructive pulmonary disease) (HCC)   . Hypertension     Patient Active Problem List   Diagnosis Date Noted  . Protein-calorie malnutrition, severe 08/09/2016  . Hypokalemia 08/08/2016  . Hypokalemia due to loss of potassium 08/08/2016  . Dyspnea on exertion 08/08/2016  . Chronic obstructive pulmonary disease (HCC) 08/07/2016  . Congestive heart failure (HCC) 08/07/2016  . Aortic stenosis 10/29/2012  . Cardiomyopathy (HCC) 10/29/2012  . CHF (congestive heart failure) (HCC) 08/18/2012    Past Surgical History:  Procedure Laterality Date  . ABDOMINAL HYSTERECTOMY    . APPENDECTOMY    . CARDIAC SURGERY     valve replacement  . PACEMAKER INSERTION      OB History    No data available       Home Medications    Prior to Admission  medications   Medication Sig Start Date End Date Taking? Authorizing Provider  albuterol (PROVENTIL HFA;VENTOLIN HFA) 108 (90 Base) MCG/ACT inhaler Inhale 2 puffs into the lungs every 6 (six) hours as needed for wheezing or shortness of breath.   Yes [provider]  albuterol (PROVENTIL) (2.5 MG/3ML) 0.083% nebulizer solution Take 2.5 mg by nebulization every 4 (four) hours as needed for wheezing or shortness of breath.   Yes [provider]  aspirin 81 MG chewable tablet Chew 81 mg by mouth every other day.   Yes [provider]  atorvastatin (LIPITOR) 40 MG tablet Take 40 mg by mouth daily.   Yes [provider]  budesonide-formoterol (SYMBICORT) 160-4.5 MCG/ACT inhaler Inhale 1 puff into the lungs 2 (two) times daily.  07/04/15  Yes [provider]  furosemide (LASIX) 20 MG tablet Take 20 mg by mouth daily. 05/11/16  Yes [provider]  ibuprofen (ADVIL,MOTRIN) 800 MG tablet Take 800 mg by mouth every 8 (eight) hours as needed for mild pain.  08/07/16  Yes [provider]  losartan (COZAAR) 25 MG tablet Take 25 mg by mouth daily. 04/10/16  Yes [provider]  meclizine (ANTIVERT) 12.5 MG tablet Take 12.5 mg by mouth every 4 (four) hours as needed for dizziness.   Yes [provider]  metoprolol succinate (TOPROL-XL) 25 MG 24 hr tablet Take 25 mg by mouth daily. 05/11/16  Yes [provider]  potassium chloride  20 MEQ TBCR Take 10 mEq by mouth 2 (two) times daily. 08/09/16  Yes Eulah Pont, MD    Family History No family history on file.  Social History Social History  Substance Use Topics  . Smoking status: Current Every Day Smoker  . Smokeless tobacco: Never Used  . Alcohol use Yes     Allergies   Tiotropium bromide monohydrate; Doxycycline; and Levaquin  [levofloxacin]   Review of Systems Review of Systems  Constitutional: Negative for fever.  Respiratory: Positive for cough and shortness of breath.    Cardiovascular: Negative for chest pain.  Gastrointestinal: Negative for abdominal pain.  All other systems reviewed and are negative.    Physical Exam Updated Vital Signs BP 129/88 (BP Location: Right Arm)   Pulse 96   Temp (!) 97.5 F (36.4 C) (Oral)   Resp (!) 28   Ht 1.689 m (5' 6.5")   Wt 43.1 kg (95 lb)   SpO2 96%   BMI 15.10 kg/m   Physical Exam  CONSTITUTIONAL: elderly, frail, tachypneic HEAD: Normocephalic/atraumatic EYES: EOMI ENMT: Mucous membranes moist NECK: supple no meningeal signs SPINE/BACK:entire spine nontender CV: S1/S2 noted, murmur noted LUNGS: tachypneic.  Coarse BS noted.  Decreased breath sounds noted in right chest Chest - midline sternotomy scar noted ABDOMEN: soft, nontender  NEURO: Pt is awake/alert/appropriate, moves all extremitiesx4.  No facial droop.   EXTREMITIES: pulses normal/equal, full ROM, no calf tenderness noted SKIN: warm, color normal PSYCH: no abnormalities of mood noted, alert and oriented to situation  ED Treatments / Results  Labs (all labs ordered are listed, but only abnormal results are displayed) Labs Reviewed  BASIC METABOLIC PANEL - Abnormal; Notable for the following:       Result Value   Glucose, Bld 109 (*)    Calcium 8.4 (*)    All other components within normal limits  CBC WITH DIFFERENTIAL/PLATELET - Abnormal; Notable for the following:    WBC 22.7 (*)    RBC 2.99 (*)    Hemoglobin 8.8 (*)    HCT 28.0 (*)    Platelets 436 (*)    Neutro Abs 19.8 (*)    Monocytes Absolute 1.8 (*)    All other components within normal limits  PROTIME-INR    EKG  EKG Interpretation  Date/Time:  Saturday December 22 2016 05:23:51 EDT Ventricular Rate:  87 PR Interval:    QRS Duration: 131 QT Interval:  360 QTC Calculation: 433 R Axis:   -72 Text Interpretation:  Sinus rhythm Multiple premature complexes, vent & supraven RBBB and LAFB Minimal ST elevation, lateral leads Baseline wander in lead(s) V5  Interpretation limited secondary to artifact Confirmed by Zadie Rhine ([id number]) on 12/22/2016 5:43:45 AM       Radiology Dg Chest Portable 1 View  Result Date: 12/22/2016 CLINICAL DATA:  Right-sided pneumothorax. Previous chest x-ray revealed 75% collapse of right lung. Crackles on the left side. Cough. EXAM: PORTABLE CHEST 1 VIEW COMPARISON:  08/08/2016. The described chest radiograph from 12/21/2016 is not available on our system. FINDINGS: Cardiac pacemaker. Postoperative changes in the mediastinum. As described from previous study, there is a large right pneumothorax with about 75% collapse of the right lung. Consolidation or atelectasis in the right lung base. Mild subcutaneous emphysema in the right lateral chest wall. No chest tubes identified. No mediastinal shift. Left lung is clear and expanded. Emphysematous changes in the left lung. Heart size and pulmonary vascularity are normal. Calcification of the aorta. IMPRESSION: Large right pneumothorax with collapse  of the right lung consistent with report of previous chest x-ray findings. Subcutaneous emphysema along the right lateral chest wall. These results were called by telephone at the time of interpretation on 12/22/2016 at 5:58 am to Dr. Zadie Rhine , who verbally acknowledged these results. Electronically Signed   By: Burman Nieves M.D.   On: 12/22/2016 06:00    Procedures Procedures (including critical care time)  Medications Ordered in ED Medications - No data to display   Initial Impression / Assessment and Plan / ED Course  I have reviewed the triage vital signs and the nursing notes.  Pertinent labs & imaging results that were available during my care of the patient were reviewed by me and considered in my medical decision making (see chart for details).     6:13 AM Pt with recent aortic valve replacement at [facility] She is here with PTX Per records, she had right sided chest tube post op and was eventually  removed She also has stable anemia Will consult CT surgery 7:52 AM Dr Laneta Simmers at bedside placing right sided chest tube I have spoken to Dr Belva Crome with CT surgery at [facility] After discussion, he will accept in transfer since patient just had TAVR at their facility 7:59 AM Chest tube placed by dr bartle Pt stable Will be transferred to [facility] D/w case with dr ward prior to patient is transferred to [facility]  Final Clinical Impressions(s) / ED Diagnoses   Final diagnoses:  Pneumothorax on right    New Prescriptions New Prescriptions   No medications on file     Zadie Rhine, MD 12/22/16 [phone]

## 2016-12-22 NOTE — CV Procedure (Signed)
Chest Tube Insertion Procedure Note  Indications:  Clinically significant right Pneumothorax  Pre-operative Diagnosis: Right Pneumothorax  Post-operative Diagnosis: Right Pneumothorax  Procedure Details  Informed consent was obtained for the procedure, including sedation.  Risks of lung perforation, hemorrhage, arrhythmia, and adverse drug reaction were discussed.   After sterile skin prep, using standard technique, 10 cc 1% lidocaine local anesthesia a 20 French tube was placed in the right lateral 7th rib space.  Findings: Rush of air  Estimated Blood Loss:  Minimal         Specimens:  None              Complications:  None; patient tolerated the procedure well.         Disposition: performed in ER         Condition: stable  Attending Attestation: I performed the procedure.

## 2016-12-22 NOTE — ED Notes (Signed)
Dr. Laneta Simmers at bedside placing right sided chest tube placement.

## 2017-07-31 ENCOUNTER — Other Ambulatory Visit: Payer: Self-pay

## 2017-07-31 ENCOUNTER — Emergency Department (HOSPITAL_COMMUNITY): Payer: Medicare Other

## 2017-07-31 ENCOUNTER — Inpatient Hospital Stay (HOSPITAL_COMMUNITY)
Admission: EM | Admit: 2017-07-31 | Discharge: 2017-08-06 | DRG: 640 | Disposition: A | Discharge status: Skilled Nursing Facility | Payer: Medicare Other | Attending: Internal Medicine | Admitting: Internal Medicine

## 2017-07-31 ENCOUNTER — Encounter (HOSPITAL_COMMUNITY): Payer: Self-pay

## 2017-07-31 DIAGNOSIS — A419 Sepsis, unspecified organism: Secondary | ICD-10-CM

## 2017-07-31 DIAGNOSIS — E440 Moderate protein-calorie malnutrition: Secondary | ICD-10-CM

## 2017-07-31 DIAGNOSIS — Z7982 Long term (current) use of aspirin: Secondary | ICD-10-CM

## 2017-07-31 DIAGNOSIS — E860 Dehydration: Principal | ICD-10-CM | POA: Diagnosis present

## 2017-07-31 DIAGNOSIS — R296 Repeated falls: Secondary | ICD-10-CM | POA: Diagnosis present

## 2017-07-31 DIAGNOSIS — N390 Urinary tract infection, site not specified: Secondary | ICD-10-CM | POA: Diagnosis present

## 2017-07-31 DIAGNOSIS — I110 Hypertensive heart disease with heart failure: Secondary | ICD-10-CM | POA: Diagnosis present

## 2017-07-31 DIAGNOSIS — H9190 Unspecified hearing loss, unspecified ear: Secondary | ICD-10-CM | POA: Diagnosis present

## 2017-07-31 DIAGNOSIS — F329 Major depressive disorder, single episode, unspecified: Secondary | ICD-10-CM | POA: Diagnosis present

## 2017-07-31 DIAGNOSIS — Z952 Presence of prosthetic heart valve: Secondary | ICD-10-CM

## 2017-07-31 DIAGNOSIS — R531 Weakness: Secondary | ICD-10-CM

## 2017-07-31 DIAGNOSIS — I959 Hypotension, unspecified: Secondary | ICD-10-CM | POA: Diagnosis present

## 2017-07-31 DIAGNOSIS — I350 Nonrheumatic aortic (valve) stenosis: Secondary | ICD-10-CM

## 2017-07-31 DIAGNOSIS — R509 Fever, unspecified: Secondary | ICD-10-CM

## 2017-07-31 DIAGNOSIS — I5022 Chronic systolic (congestive) heart failure: Secondary | ICD-10-CM | POA: Diagnosis present

## 2017-07-31 DIAGNOSIS — I509 Heart failure, unspecified: Secondary | ICD-10-CM

## 2017-07-31 DIAGNOSIS — Z87891 Personal history of nicotine dependence: Secondary | ICD-10-CM

## 2017-07-31 DIAGNOSIS — Z950 Presence of cardiac pacemaker: Secondary | ICD-10-CM

## 2017-07-31 DIAGNOSIS — J449 Chronic obstructive pulmonary disease, unspecified: Secondary | ICD-10-CM | POA: Diagnosis present

## 2017-07-31 DIAGNOSIS — J440 Chronic obstructive pulmonary disease with acute lower respiratory infection: Secondary | ICD-10-CM | POA: Diagnosis not present

## 2017-07-31 DIAGNOSIS — Z23 Encounter for immunization: Secondary | ICD-10-CM

## 2017-07-31 DIAGNOSIS — R4182 Altered mental status, unspecified: Secondary | ICD-10-CM | POA: Diagnosis not present

## 2017-07-31 DIAGNOSIS — G9341 Metabolic encephalopathy: Secondary | ICD-10-CM | POA: Diagnosis present

## 2017-07-31 DIAGNOSIS — D649 Anemia, unspecified: Secondary | ICD-10-CM | POA: Diagnosis present

## 2017-07-31 DIAGNOSIS — E876 Hypokalemia: Secondary | ICD-10-CM | POA: Diagnosis present

## 2017-07-31 DIAGNOSIS — L89151 Pressure ulcer of sacral region, stage 1: Secondary | ICD-10-CM | POA: Diagnosis present

## 2017-07-31 DIAGNOSIS — J209 Acute bronchitis, unspecified: Secondary | ICD-10-CM

## 2017-07-31 LAB — CK: Total CK: 430 U/L — ABNORMAL HIGH (ref 38–234)

## 2017-07-31 LAB — URINALYSIS, ROUTINE W REFLEX MICROSCOPIC
Bacteria, UA: NONE SEEN
Bilirubin Urine: NEGATIVE
GLUCOSE, UA: NEGATIVE mg/dL
Ketones, ur: NEGATIVE mg/dL
NITRITE: POSITIVE — AB
PROTEIN: 30 mg/dL — AB
Specific Gravity, Urine: 1.017 (ref 1.005–1.030)
pH: 8 (ref 5.0–8.0)

## 2017-07-31 LAB — CBC
HEMATOCRIT: 31.2 % — AB (ref 36.0–46.0)
Hemoglobin: 9.6 g/dL — ABNORMAL LOW (ref 12.0–15.0)
MCH: 26.3 pg (ref 26.0–34.0)
MCHC: 30.8 g/dL (ref 30.0–36.0)
MCV: 85.5 fL (ref 78.0–100.0)
Platelets: 316 10*3/uL (ref 150–400)
RBC: 3.65 MIL/uL — ABNORMAL LOW (ref 3.87–5.11)
RDW: 16.2 % — ABNORMAL HIGH (ref 11.5–15.5)
WBC: 11 10*3/uL — ABNORMAL HIGH (ref 4.0–10.5)

## 2017-07-31 LAB — CBG MONITORING, ED: Glucose-Capillary: 102 mg/dL — ABNORMAL HIGH (ref 65–99)

## 2017-07-31 LAB — BASIC METABOLIC PANEL
ANION GAP: 9 (ref 5–15)
BUN: 30 mg/dL — ABNORMAL HIGH (ref 6–20)
CHLORIDE: 111 mmol/L (ref 101–111)
CO2: 22 mmol/L (ref 22–32)
Calcium: 8.9 mg/dL (ref 8.9–10.3)
Creatinine, Ser: 0.98 mg/dL (ref 0.44–1.00)
GFR calc Af Amer: >60 mL/min (ref >60)
GFR, EST NON AFRICAN AMERICAN: 55 mL/min — AB (ref >60)
GLUCOSE: 111 mg/dL — AB (ref 65–99)
Potassium: 3.4 mmol/L — ABNORMAL LOW (ref 3.5–5.1)
Sodium: 142 mmol/L (ref 135–145)

## 2017-07-31 LAB — I-STAT TROPONIN, ED: TROPONIN I, POC: 0.01 ng/mL (ref 0.00–0.08)

## 2017-07-31 MED ORDER — TETANUS-DIPHTH-ACELL PERTUSSIS 5-2.5-18.5 LF-MCG/0.5 IM SUSP
0.5000 mL | Freq: Once | INTRAMUSCULAR | Status: AC
  Administered 2017-07-31: 0.5 mL via INTRAMUSCULAR
  Filled 2017-07-31: qty 0.5

## 2017-07-31 MED ORDER — SODIUM CHLORIDE 0.9 % IV SOLN
1.0000 g | Freq: Once | INTRAVENOUS | Status: AC
  Administered 2017-07-31: 1 g via INTRAVENOUS
  Filled 2017-07-31: qty 10

## 2017-07-31 MED ORDER — SODIUM CHLORIDE 0.9 % IV BOLUS
1000.0000 mL | Freq: Once | INTRAVENOUS | Status: AC
  Administered 2017-07-31: 1000 mL via INTRAVENOUS

## 2017-07-31 NOTE — ED Notes (Signed)
Pt called to be triaged with no response.  x2

## 2017-07-31 NOTE — ED Triage Notes (Signed)
Patient's daughter was found Sunday laying in the floor. Patient's daughter reports that they were helping her to the bathroom at that time ,she tripped and hit her head. Patient has small lacerations to the face. Patient's family reports that she has been sleeping for long period of time and sitting for long periods without moving.  Patient c/o dysuria and small amounts of urine in her brief.

## 2017-07-31 NOTE — ED Provider Notes (Signed)
[facility]-EMERGENCY DEPT Provider Note   CSN: [id number] Arrival date & time: 07/31/17  1831     History   Chief Complaint Chief Complaint  Patient presents with  . Dysuria  . Weakness    HPI Karen Larson is a 77 y.o. female history of hypertension, COPD, CHF here presenting with weakness, fall.  Patient lives at home by herself and apparently had a fall 4 days ago.  She called her family and they were able to help her get up and she was able to ambulate afterwards.  She could not tell me how exactly how she fell but family noticed a laceration of the left forehead and chin.  She has not been eating and drinking much last several days and since yesterday, patient has been sitting in her chair and unable to get up.  Family was concerned and came to visit her.  They also noticed that she had for the last 24 hours and she complains of some dysuria as well.  The history is provided by the patient and a relative.    Past Medical History:  Diagnosis Date  . Aortic valve stenosis   . Arthritis   . Cardiomyopathy (HCC)   . CHF (congestive heart failure) (HCC)   . COPD (chronic obstructive pulmonary disease) (HCC)   . Hypertension     Patient Active Problem List   Diagnosis Date Noted  . Protein-calorie malnutrition, severe 08/09/2016  . Hypokalemia 08/08/2016  . Hypokalemia due to loss of potassium 08/08/2016  . Dyspnea on exertion 08/08/2016  . Chronic obstructive pulmonary disease (HCC) 08/07/2016  . Congestive heart failure (HCC) 08/07/2016  . Aortic stenosis 10/29/2012  . Cardiomyopathy (HCC) 10/29/2012  . CHF (congestive heart failure) (HCC) 08/18/2012    Past Surgical History:  Procedure Laterality Date  . ABDOMINAL HYSTERECTOMY    . APPENDECTOMY    . CARDIAC SURGERY     valve replacement  . PACEMAKER INSERTION       OB History   None      Home Medications    Prior to Admission medications   Medication Sig Start Date End Date  Taking? Authorizing Provider  aspirin 81 MG chewable tablet Chew 81 mg by mouth every other day.   Yes [provider]  atorvastatin (LIPITOR) 40 MG tablet Take 40 mg by mouth daily.   Yes [provider]  escitalopram (LEXAPRO) 20 MG tablet Take 20 mg by mouth daily.   Yes [provider]  losartan (COZAAR) 25 MG tablet Take 25 mg by mouth daily. 04/10/16  Yes [provider]  potassium chloride 20 MEQ TBCR Take 10 mEq by mouth 2 (two) times daily. Patient not taking: Reported on 07/31/2017 08/09/16   Eulah Pont, MD    Family History No family history on file.  Social History Social History   Tobacco Use  . Smoking status: Former Games developer  . Smokeless tobacco: Never Used  Substance Use Topics  . Alcohol use: Not Currently  . Drug use: No     Allergies   Tiotropium bromide monohydrate; Doxycycline; and Levaquin  [levofloxacin]   Review of Systems Review of Systems  Genitourinary: Positive for dysuria.  Neurological: Positive for weakness.  All other systems reviewed and are negative.    Physical Exam Updated Vital Signs BP (!) 106/55 (BP Location: Left Arm)   Pulse 95   Temp 98 F (36.7 C) (Oral)   Resp (!) 25   Ht 5' 6.5" (1.689 m)  Wt 51.3 kg (113 lb)   SpO2 99%   BMI 17.97 kg/m   Physical Exam  Constitutional: She is oriented to person, place, and time.  Dehydrated, MM dry   HENT:  Head: Normocephalic.  MM dry. Healing 1 cm laceration L forearm and 1 cm laceration L chin, no obvious bony tenderness, no jaw tenderness   Eyes: Pupils are equal, round, and reactive to light. Conjunctivae and EOM are normal.  Neck: Normal range of motion. Neck supple.  Cardiovascular: Normal rate, regular rhythm and normal heart sounds.  Pulmonary/Chest: Effort normal and breath sounds normal. No stridor. No respiratory distress. She has no wheezes.  Abdominal: Soft. Bowel sounds are normal. She exhibits no distension. There is no tenderness.  There is no guarding.  Musculoskeletal:  Stage 1 decub ulcer. Dec ROM bilateral hips but no obvious deformity. No obvious spinal tenderness. Bruising L foot.   Neurological: She is alert and oriented to person, place, and time.  Skin: Skin is warm.  Psychiatric: She has a normal mood and affect.  Nursing note and vitals reviewed.    ED Treatments / Results  Labs (all labs ordered are listed, but only abnormal results are displayed) Labs Reviewed  BASIC METABOLIC PANEL - Abnormal; Notable for the following components:      Result Value   Potassium 3.4 (*)    Glucose, Bld 111 (*)    BUN 30 (*)    GFR calc non Af Amer 55 (*)    All other components within normal limits  CBC - Abnormal; Notable for the following components:   WBC 11.0 (*)    RBC 3.65 (*)    Hemoglobin 9.6 (*)    HCT 31.2 (*)    RDW 16.2 (*)    All other components within normal limits  CBG MONITORING, ED - Abnormal; Notable for the following components:   Glucose-Capillary 102 (*)    All other components within normal limits  URINE CULTURE  URINALYSIS, ROUTINE W REFLEX MICROSCOPIC  CK  I-STAT TROPONIN, ED    EKG EKG Interpretation  Date/Time:  Wednesday Jul 31 2017 19:50:59 EDT Ventricular Rate:  99 PR Interval:    QRS Duration: 132 QT Interval:  343 QTC Calculation: 441 R Axis:   -75 Text Interpretation:  Sinus rhythm RBBB and LAFB Baseline wander in lead(s) V6 No significant change since last tracing Confirmed by Richardean Canal ([id number]) on 07/31/2017 8:22:36 PM   Radiology Dg Chest 1 View  Result Date: 07/31/2017 CLINICAL DATA:  Fall. EXAM: CHEST  1 VIEW COMPARISON:  December 22, 2016 FINDINGS: Cardiomediastinal silhouette is stable. Stable pacemaker. No pneumothorax. No nodules or masses. No focal infiltrates. No pneumothorax. No visualized fractures. IMPRESSION: No active disease. Electronically Signed   By: Gerome Sam III M.D   On: 07/31/2017 21:21   Dg Pelvis 1-2 Views  Result Date:  07/31/2017 CLINICAL DATA:  Pain after fall EXAM: PELVIS - 1-2 VIEW COMPARISON:  None. FINDINGS: Dense calcified atherosclerosis in the aorta and iliac vessels. Degenerative changes in the lower lumbar spine. No acute fractures. IMPRESSION: Negative. Electronically Signed   By: Gerome Sam III M.D   On: 07/31/2017 21:22   Ct Head Wo Contrast  Result Date: 07/31/2017 CLINICAL DATA:  Head trauma.  Status post fall. EXAM: CT HEAD WITHOUT CONTRAST CT CERVICAL SPINE WITHOUT CONTRAST TECHNIQUE: Multidetector CT imaging of the head and cervical spine was performed following the standard protocol without intravenous contrast. Multiplanar CT image reconstructions of the cervical  spine were also generated. COMPARISON:  None. FINDINGS: CT HEAD FINDINGS Brain: No evidence of acute infarction, hemorrhage, extra-axial collection, ventriculomegaly, or mass effect. Generalized cerebral atrophy. Periventricular white matter low attenuation likely secondary to microangiopathy. Vascular: Cerebrovascular atherosclerotic calcifications are noted. Skull: Negative for fracture or focal lesion. Sinuses/Orbits: Visualized portions of the orbits are unremarkable. Visualized portions of the paranasal sinuses and mastoid air cells are unremarkable. Other: None. CT CERVICAL SPINE FINDINGS Alignment: 4 mm anterolisthesis of C4 on C5 secondary to facet disease. Skull base and vertebrae: No acute fracture. No primary bone lesion or focal pathologic process. Soft tissues and spinal canal: No prevertebral fluid or swelling. No visible canal hematoma. Disc levels: Degenerative disc disease with disc height loss at C5-6 and C6-7. Moderate right facet arthropathy at C2-3. Moderate left and mild right facet arthropathy at C3-4 with left foraminal stenosis. Broad-based disc osteophyte complex at C4-5. Bilateral uncovertebral degenerative changes at C5-6. Left uncovertebral degenerative changes at C6-7 with foraminal narrowing. Upper chest: Lung  apices are clear. Other: No fluid collection or hematoma. IMPRESSION: 1. No acute intracranial pathology. 2.  No acute osseous injury of the cervical spine. Electronically Signed   By: Elige Ko   On: 07/31/2017 21:04   Ct Cervical Spine Wo Contrast  Result Date: 07/31/2017 CLINICAL DATA:  Head trauma.  Status post fall. EXAM: CT HEAD WITHOUT CONTRAST CT CERVICAL SPINE WITHOUT CONTRAST TECHNIQUE: Multidetector CT imaging of the head and cervical spine was performed following the standard protocol without intravenous contrast. Multiplanar CT image reconstructions of the cervical spine were also generated. COMPARISON:  None. FINDINGS: CT HEAD FINDINGS Brain: No evidence of acute infarction, hemorrhage, extra-axial collection, ventriculomegaly, or mass effect. Generalized cerebral atrophy. Periventricular white matter low attenuation likely secondary to microangiopathy. Vascular: Cerebrovascular atherosclerotic calcifications are noted. Skull: Negative for fracture or focal lesion. Sinuses/Orbits: Visualized portions of the orbits are unremarkable. Visualized portions of the paranasal sinuses and mastoid air cells are unremarkable. Other: None. CT CERVICAL SPINE FINDINGS Alignment: 4 mm anterolisthesis of C4 on C5 secondary to facet disease. Skull base and vertebrae: No acute fracture. No primary bone lesion or focal pathologic process. Soft tissues and spinal canal: No prevertebral fluid or swelling. No visible canal hematoma. Disc levels: Degenerative disc disease with disc height loss at C5-6 and C6-7. Moderate right facet arthropathy at C2-3. Moderate left and mild right facet arthropathy at C3-4 with left foraminal stenosis. Broad-based disc osteophyte complex at C4-5. Bilateral uncovertebral degenerative changes at C5-6. Left uncovertebral degenerative changes at C6-7 with foraminal narrowing. Upper chest: Lung apices are clear. Other: No fluid collection or hematoma. IMPRESSION: 1. No acute intracranial  pathology. 2.  No acute osseous injury of the cervical spine. Electronically Signed   By: Elige Ko   On: 07/31/2017 21:04   Dg Foot Complete Left  Result Date: 07/31/2017 CLINICAL DATA:  Patient with pain to the anterior foot status post fall. Initial encounter. EXAM: LEFT FOOT - COMPLETE 3+ VIEW COMPARISON:  None. FINDINGS: Normal anatomic alignment. Midfoot degenerative changes. First MTP joint degenerative changes. No evidence for acute fracture or dislocation. IMPRESSION: No acute osseous abnormality.  Degenerative changes. Electronically Signed   By: Annia Belt M.D.   On: 07/31/2017 21:23    Procedures Procedures (including critical care time)  Medications Ordered in ED Medications  sodium chloride 0.9 % bolus 1,000 mL (1,000 mLs Intravenous New Bag/Given 07/31/17 2144)  Tdap (BOOSTRIX) injection 0.5 mL (0.5 mLs Intramuscular Given 07/31/17 2145)  Initial Impression / Assessment and Plan / ED Course  I have reviewed the triage vital signs and the nursing notes.  Pertinent labs & imaging results that were available during my care of the patient were reviewed by me and considered in my medical decision making (see chart for details).     Karen Larson is a 76 y.o. female here with weakness, fall, failure to thrive. Decreased PO intake. Appears dehydrated. Also had several falls and is too weak to walk. Has decrease urine output as well. Consider traumatic subarachnoid vs UTI vs dehydration. Will get labs, CT head/neck, xrays, UA.;   10:55 PM UA + UTI, ordered rocephin. CK 430. CT head/neck unremarkable. She is still unable to get up and ambulate. Will admit for weakness from UTI, stage 1 decub ulcer, dehydration.    Final Clinical Impressions(s) / ED Diagnoses   Final diagnoses:  None    ED Discharge Orders    None       Charlynne Pander, MD 07/31/17 2257

## 2017-07-31 NOTE — H&P (Addendum)
History and Physical    Karen Larson ZOX:[id number] DOB: [birth year] DOA: 07/31/2017  PCP: System, Pcp Not In   Patient coming from: Home   Chief Complaint: Altered mental status  HPI: Karen Larson is a 77 y.o. female with medical history significant for congestive heart failure COPD, aortic stenosis, who was brought to the ED via EMS reports that patient was sleeping for long periods of time and sitting for long periods of time without moving.  On Sunday patient's daughter found patient on the floor after she had falling.  Patient is awake and alert and able to give me some history.  She tells me she was weak unable to get up, and subsequently fell when she attempted to get up.  Patient was unable to get up, onto family members arrived. she endorses dysuria cannot tell me duration, but she denies fever or chills.  Family reports poor p.o. intake over the past few days.  Patient endorses a mild intermittent  dry cough but denies shortness of breath, no vomiting abdominal pain no loose stools no headaches no neck stiffness, no chest pain, no leg swelling.  ED Course: temperature 98, blood pressure systolic 94-126, heart rate 90s, WBC 11, CK 430, hemoglobin 9.6, K mildly low 3.4, creatinine at baseline 0.98.  UA-positive nitrite small leukocytes.  EKG unchanged from prior.  I-STAT troponin negative.  Chest x-ray negative for acute abnormality.  Left foot x-ray pelvic x-ray cervical CT and head CT, all negative for acute abnormality.  Patient was started on IV ceftriaxone in the ED, given Tdap, urine cultures ordered, 1L saline bolus given.  Otitis was called to admit for altered mental status, dehydration , UTI.  Review of Systems: As per HPI otherwise 10 point review of systems negative.   Past Medical History:  Diagnosis Date  . Aortic valve stenosis   . Arthritis   . Cardiomyopathy (HCC)   . CHF (congestive heart failure) (HCC)   . COPD (chronic obstructive pulmonary disease) (HCC)   .  Hypertension     Past Surgical History:  Procedure Laterality Date  . ABDOMINAL HYSTERECTOMY    . APPENDECTOMY    . CARDIAC SURGERY     valve replacement  . PACEMAKER INSERTION       reports that she has quit smoking. She has never used smokeless tobacco. She reports that she drank alcohol. She reports that she does not use drugs.  Allergies  Allergen Reactions  . Tiotropium Bromide Monohydrate Nausea Only  . Doxycycline Nausea Only  . Levaquin  [Levofloxacin] Nausea Only   Family history not pertinent.  Prior to Admission medications   Medication Sig Start Date End Date Taking? Authorizing Provider  aspirin 81 MG chewable tablet Chew 81 mg by mouth every other day.   Yes [provider]  atorvastatin (LIPITOR) 40 MG tablet Take 40 mg by mouth daily.   Yes [provider]  escitalopram (LEXAPRO) 20 MG tablet Take 20 mg by mouth daily.   Yes [provider]  losartan (COZAAR) 25 MG tablet Take 25 mg by mouth daily. 04/10/16  Yes [provider]  potassium chloride 20 MEQ TBCR Take 10 mEq by mouth 2 (two) times daily. Patient not taking: Reported on 07/31/2017 08/09/16   Eulah Pont, MD    Physical Exam: Vitals:   07/31/17 1840 07/31/17 1914 07/31/17 2122  BP: 102/70  (!) 106/55  Pulse: 90  95  Resp: 17  (!) 25  Temp: 98 F (36.7 C)  TempSrc: Oral    SpO2: 97%  99%  Weight:  51.3 kg (113 lb)   Height:  5' 6.5" (1.689 m)     Constitutional: NAD, calm, comfortable Vitals:   07/31/17 1840 07/31/17 1914 07/31/17 2122  BP: 102/70  (!) 106/55  Pulse: 90  95  Resp: 17  (!) 25  Temp: 98 F (36.7 C)    TempSrc: Oral    SpO2: 97%  99%  Weight:  51.3 kg (113 lb)   Height:  5' 6.5" (1.689 m)    Eyes: PERRL, lids and conjunctivae normal ENMT: Mucous membranes are very dry. Posterior pharynx clear of any exudate or lesions.Normal dentition.  Neck: normal, supple, no masses, no thyromegaly Respiratory: clear to auscultation bilaterally, no  wheezing, no crackles. Normal respiratory effort. No accessory muscle use.  Cardiovascular: Regular rate and rhythm, unable to elucidate a murmur, no extremity edema. 2+ pedal pulses. No carotid bruits.  Abdomen: no tenderness, no masses palpated. No hepatosplenomegaly. Bowel sounds positive.  Musculoskeletal: no clubbing / cyanosis. No joint deformity upper and lower extremities. Good ROM, no contractures. Normal muscle tone.  Skin: Diffuse bruising on extremities and face from falls, though patient denies multiple falls Neurologic: CN 2-12 grossly intact. Strength 5/5 in upper extremities, moving bilateral lower extremities spontaneously, strenght 5/5 on planter flexion, but 4/5, poor effort against  Resistance- equal bilat. Psychiatric: Normal judgment and insight. Alert and oriented x 3. Normal mood.   Labs on Admission: I have personally reviewed following labs and imaging studies  CBC: Recent Labs  Lab 07/31/17 1945  WBC 11.0*  HGB 9.6*  HCT 31.2*  MCV 85.5  PLT 316   Basic Metabolic Panel: Recent Labs  Lab 07/31/17 1945  NA 142  K 3.4*  CL 111  CO2 22  GLUCOSE 111*  BUN 30*  CREATININE 0.98  CALCIUM 8.9   Cardiac Enzymes: Recent Labs  Lab 07/31/17 2001  CKTOTAL 430*   CBG: Recent Labs  Lab 07/31/17 1957  GLUCAP 102*   Urine analysis:    Component Value Date/Time   COLORURINE YELLOW 07/31/2017 2201   APPEARANCEUR HAZY (A) 07/31/2017 2201   LABSPEC 1.017 07/31/2017 2201   PHURINE 8.0 07/31/2017 2201   GLUCOSEU NEGATIVE 07/31/2017 2201   HGBUR SMALL (A) 07/31/2017 2201   BILIRUBINUR NEGATIVE 07/31/2017 2201   KETONESUR NEGATIVE 07/31/2017 2201   PROTEINUR 30 (A) 07/31/2017 2201   NITRITE POSITIVE (A) 07/31/2017 2201   LEUKOCYTESUR SMALL (A) 07/31/2017 2201    Radiological Exams on Admission: Dg Chest 1 View  Result Date: 07/31/2017 CLINICAL DATA:  Fall. EXAM: CHEST  1 VIEW COMPARISON:  December 22, 2016 FINDINGS: Cardiomediastinal silhouette is  stable. Stable pacemaker. No pneumothorax. No nodules or masses. No focal infiltrates. No pneumothorax. No visualized fractures. IMPRESSION: No active disease. Electronically Signed   By: Gerome Sam III M.D   On: 07/31/2017 21:21   Dg Pelvis 1-2 Views  Result Date: 07/31/2017 CLINICAL DATA:  Pain after fall EXAM: PELVIS - 1-2 VIEW COMPARISON:  None. FINDINGS: Dense calcified atherosclerosis in the aorta and iliac vessels. Degenerative changes in the lower lumbar spine. No acute fractures. IMPRESSION: Negative. Electronically Signed   By: Gerome Sam III M.D   On: 07/31/2017 21:22   Ct Head Wo Contrast  Result Date: 07/31/2017 CLINICAL DATA:  Head trauma.  Status post fall. EXAM: CT HEAD WITHOUT CONTRAST CT CERVICAL SPINE WITHOUT CONTRAST TECHNIQUE: Multidetector CT imaging of the head and cervical spine was performed following the  standard protocol without intravenous contrast. Multiplanar CT image reconstructions of the cervical spine were also generated. COMPARISON:  None. FINDINGS: CT HEAD FINDINGS Brain: No evidence of acute infarction, hemorrhage, extra-axial collection, ventriculomegaly, or mass effect. Generalized cerebral atrophy. Periventricular white matter low attenuation likely secondary to microangiopathy. Vascular: Cerebrovascular atherosclerotic calcifications are noted. Skull: Negative for fracture or focal lesion. Sinuses/Orbits: Visualized portions of the orbits are unremarkable. Visualized portions of the paranasal sinuses and mastoid air cells are unremarkable. Other: None. CT CERVICAL SPINE FINDINGS Alignment: 4 mm anterolisthesis of C4 on C5 secondary to facet disease. Skull base and vertebrae: No acute fracture. No primary bone lesion or focal pathologic process. Soft tissues and spinal canal: No prevertebral fluid or swelling. No visible canal hematoma. Disc levels: Degenerative disc disease with disc height loss at C5-6 and C6-7. Moderate right facet arthropathy at C2-3.  Moderate left and mild right facet arthropathy at C3-4 with left foraminal stenosis. Broad-based disc osteophyte complex at C4-5. Bilateral uncovertebral degenerative changes at C5-6. Left uncovertebral degenerative changes at C6-7 with foraminal narrowing. Upper chest: Lung apices are clear. Other: No fluid collection or hematoma. IMPRESSION: 1. No acute intracranial pathology. 2.  No acute osseous injury of the cervical spine. Electronically Signed   By: Elige Ko   On: 07/31/2017 21:04   Ct Cervical Spine Wo Contrast  Result Date: 07/31/2017 CLINICAL DATA:  Head trauma.  Status post fall. EXAM: CT HEAD WITHOUT CONTRAST CT CERVICAL SPINE WITHOUT CONTRAST TECHNIQUE: Multidetector CT imaging of the head and cervical spine was performed following the standard protocol without intravenous contrast. Multiplanar CT image reconstructions of the cervical spine were also generated. COMPARISON:  None. FINDINGS: CT HEAD FINDINGS Brain: No evidence of acute infarction, hemorrhage, extra-axial collection, ventriculomegaly, or mass effect. Generalized cerebral atrophy. Periventricular white matter low attenuation likely secondary to microangiopathy. Vascular: Cerebrovascular atherosclerotic calcifications are noted. Skull: Negative for fracture or focal lesion. Sinuses/Orbits: Visualized portions of the orbits are unremarkable. Visualized portions of the paranasal sinuses and mastoid air cells are unremarkable. Other: None. CT CERVICAL SPINE FINDINGS Alignment: 4 mm anterolisthesis of C4 on C5 secondary to facet disease. Skull base and vertebrae: No acute fracture. No primary bone lesion or focal pathologic process. Soft tissues and spinal canal: No prevertebral fluid or swelling. No visible canal hematoma. Disc levels: Degenerative disc disease with disc height loss at C5-6 and C6-7. Moderate right facet arthropathy at C2-3. Moderate left and mild right facet arthropathy at C3-4 with left foraminal stenosis. Broad-based  disc osteophyte complex at C4-5. Bilateral uncovertebral degenerative changes at C5-6. Left uncovertebral degenerative changes at C6-7 with foraminal narrowing. Upper chest: Lung apices are clear. Other: No fluid collection or hematoma. IMPRESSION: 1. No acute intracranial pathology. 2.  No acute osseous injury of the cervical spine. Electronically Signed   By: Elige Ko   On: 07/31/2017 21:04   Dg Foot Complete Left  Result Date: 07/31/2017 CLINICAL DATA:  Patient with pain to the anterior foot status post fall. Initial encounter. EXAM: LEFT FOOT - COMPLETE 3+ VIEW COMPARISON:  None. FINDINGS: Normal anatomic alignment. Midfoot degenerative changes. First MTP joint degenerative changes. No evidence for acute fracture or dislocation. IMPRESSION: No acute osseous abnormality.  Degenerative changes. Electronically Signed   By: Annia Belt M.D.   On: 07/31/2017 21:23    EKG: Independently reviewed. 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   Assessment/Plan Principal Problem:   Altered mental status Active Problems:   CHF (congestive heart failure) (HCC)   Chronic obstructive  pulmonary disease (HCC)   Metabolic encephalopathy-likely 2/2 dehydration and UTI.  Dry mucous membranes, soft blood pressure.  UA-positive nitrite small leukocytes.  WBC- 11. Head CT negative for acute abnormality.  No pulmonary GI or neuro symptoms to suggest other focus of infection.  No prior urine cultures.  -Continue IV ceftriaxone 1 g daily -Follow-up urine cultures drawn in ED -Hydrate Ns 100cc/hr x 12 hrs  Fall/syncope-not witnessed.  Patient reports she fell 2/2 generalized weakness-suggesting mechanical fall, but patient presented with altered mental status.  History of severe aortic stenosis.  I-STAT troponin negative EKG unchanged.  Head CT cervical CT pelvic x-ray right leg x-ray negative for acute abnormality.  Multiple bruising on extremities suggesting multiple falls -Echocardiogram - Trops x 3 -PT  evaluation  Hypokalemia- mild K- 3.4 -Replete -Magnesium- 2.0  Systolic CHF, hx of aortic stenosis S/p TAVR at [facility]-last echo 08/09/2016-EF 45 to 50%, G1DD, severe aortic stenosis.   COPD-stable  Anemia - hemoglobin 9.6, slow downtrend over the past the year 13 >> 8.8 >9.6 -Iron studies  HTN- soft. -Hold home antihypertensives lorsatan, hydrate  DVT prophylaxis: Lovenox Code Status: Full Family Communication: None at bedside Disposition Plan: Per rounding team Consults called: None Admission status: Obs, tele   Onnie Boer MD [facility] Pager [phone] From 6PM-2AM.  Otherwise please contact night-coverage [email or web address] Password [email or web address]  07/31/2017, 11:10 PM

## 2017-08-01 ENCOUNTER — Other Ambulatory Visit (HOSPITAL_COMMUNITY): Payer: Medicare Other

## 2017-08-01 ENCOUNTER — Inpatient Hospital Stay (HOSPITAL_COMMUNITY): Payer: Medicare Other

## 2017-08-01 DIAGNOSIS — A419 Sepsis, unspecified organism: Secondary | ICD-10-CM | POA: Diagnosis not present

## 2017-08-01 DIAGNOSIS — A858 Other specified viral encephalitis: Secondary | ICD-10-CM | POA: Diagnosis not present

## 2017-08-01 DIAGNOSIS — J449 Chronic obstructive pulmonary disease, unspecified: Secondary | ICD-10-CM | POA: Diagnosis not present

## 2017-08-01 DIAGNOSIS — I350 Nonrheumatic aortic (valve) stenosis: Secondary | ICD-10-CM | POA: Diagnosis not present

## 2017-08-01 DIAGNOSIS — I361 Nonrheumatic tricuspid (valve) insufficiency: Secondary | ICD-10-CM | POA: Diagnosis not present

## 2017-08-01 DIAGNOSIS — H9190 Unspecified hearing loss, unspecified ear: Secondary | ICD-10-CM | POA: Diagnosis present

## 2017-08-01 DIAGNOSIS — E860 Dehydration: Principal | ICD-10-CM

## 2017-08-01 DIAGNOSIS — F0390 Unspecified dementia without behavioral disturbance: Secondary | ICD-10-CM | POA: Diagnosis not present

## 2017-08-01 DIAGNOSIS — E876 Hypokalemia: Secondary | ICD-10-CM | POA: Diagnosis present

## 2017-08-01 DIAGNOSIS — D649 Anemia, unspecified: Secondary | ICD-10-CM | POA: Diagnosis present

## 2017-08-01 DIAGNOSIS — Z950 Presence of cardiac pacemaker: Secondary | ICD-10-CM | POA: Diagnosis not present

## 2017-08-01 DIAGNOSIS — I959 Hypotension, unspecified: Secondary | ICD-10-CM | POA: Diagnosis present

## 2017-08-01 DIAGNOSIS — Z7982 Long term (current) use of aspirin: Secondary | ICD-10-CM | POA: Diagnosis not present

## 2017-08-01 DIAGNOSIS — N390 Urinary tract infection, site not specified: Secondary | ICD-10-CM | POA: Diagnosis present

## 2017-08-01 DIAGNOSIS — L89151 Pressure ulcer of sacral region, stage 1: Secondary | ICD-10-CM | POA: Diagnosis present

## 2017-08-01 DIAGNOSIS — I5022 Chronic systolic (congestive) heart failure: Secondary | ICD-10-CM

## 2017-08-01 DIAGNOSIS — R296 Repeated falls: Secondary | ICD-10-CM

## 2017-08-01 DIAGNOSIS — Z87891 Personal history of nicotine dependence: Secondary | ICD-10-CM | POA: Diagnosis not present

## 2017-08-01 DIAGNOSIS — F329 Major depressive disorder, single episode, unspecified: Secondary | ICD-10-CM | POA: Diagnosis present

## 2017-08-01 DIAGNOSIS — G9341 Metabolic encephalopathy: Secondary | ICD-10-CM | POA: Diagnosis present

## 2017-08-01 DIAGNOSIS — J209 Acute bronchitis, unspecified: Secondary | ICD-10-CM | POA: Diagnosis not present

## 2017-08-01 DIAGNOSIS — I110 Hypertensive heart disease with heart failure: Secondary | ICD-10-CM | POA: Diagnosis present

## 2017-08-01 DIAGNOSIS — Z23 Encounter for immunization: Secondary | ICD-10-CM | POA: Diagnosis not present

## 2017-08-01 DIAGNOSIS — R531 Weakness: Secondary | ICD-10-CM | POA: Diagnosis not present

## 2017-08-01 DIAGNOSIS — Z952 Presence of prosthetic heart valve: Secondary | ICD-10-CM | POA: Diagnosis not present

## 2017-08-01 DIAGNOSIS — J440 Chronic obstructive pulmonary disease with acute lower respiratory infection: Secondary | ICD-10-CM | POA: Diagnosis not present

## 2017-08-01 LAB — BASIC METABOLIC PANEL
Anion gap: 7 (ref 5–15)
BUN: 23 mg/dL — ABNORMAL HIGH (ref 6–20)
CALCIUM: 8.3 mg/dL — AB (ref 8.9–10.3)
CO2: 22 mmol/L (ref 22–32)
CREATININE: 0.81 mg/dL (ref 0.44–1.00)
Chloride: 114 mmol/L — ABNORMAL HIGH (ref 101–111)
GFR calc non Af Amer: >60 mL/min (ref >60)
Glucose, Bld: 94 mg/dL (ref 65–99)
Potassium: 3.5 mmol/L (ref 3.5–5.1)
SODIUM: 143 mmol/L (ref 135–145)

## 2017-08-01 LAB — CBC
HCT: 28.5 % — ABNORMAL LOW (ref 36.0–46.0)
Hemoglobin: 8.7 g/dL — ABNORMAL LOW (ref 12.0–15.0)
MCH: 26.3 pg (ref 26.0–34.0)
MCHC: 30.5 g/dL (ref 30.0–36.0)
MCV: 86.1 fL (ref 78.0–100.0)
PLATELETS: 262 10*3/uL (ref 150–400)
RBC: 3.31 MIL/uL — AB (ref 3.87–5.11)
RDW: 16.4 % — AB (ref 11.5–15.5)
WBC: 8.6 10*3/uL (ref 4.0–10.5)

## 2017-08-01 LAB — IRON AND TIBC
Iron: 16 ug/dL — ABNORMAL LOW (ref 28–170)
SATURATION RATIOS: 6 % — AB (ref 10.4–31.8)
TIBC: 282 ug/dL (ref 250–450)
UIBC: 266 ug/dL

## 2017-08-01 LAB — FERRITIN: Ferritin: 45 ng/mL (ref 11–307)

## 2017-08-01 LAB — ECHOCARDIOGRAM COMPLETE
HEIGHTINCHES: 66.5 in
WEIGHTICAEL: 1808 [oz_av]

## 2017-08-01 LAB — MAGNESIUM: Magnesium: 2 mg/dL (ref 1.7–2.4)

## 2017-08-01 LAB — TROPONIN I

## 2017-08-01 MED ORDER — ESCITALOPRAM OXALATE 10 MG PO TABS
20.0000 mg | ORAL_TABLET | Freq: Every day | ORAL | Status: DC
  Administered 2017-08-01 – 2017-08-06 (×6): 20 mg via ORAL
  Filled 2017-08-01 (×6): qty 2

## 2017-08-01 MED ORDER — ENOXAPARIN SODIUM 40 MG/0.4ML ~~LOC~~ SOLN
40.0000 mg | SUBCUTANEOUS | Status: DC
  Administered 2017-08-02 – 2017-08-05 (×4): 40 mg via SUBCUTANEOUS
  Filled 2017-08-01 (×5): qty 0.4

## 2017-08-01 MED ORDER — ATORVASTATIN CALCIUM 40 MG PO TABS
40.0000 mg | ORAL_TABLET | Freq: Every day | ORAL | Status: DC
  Administered 2017-08-01 – 2017-08-06 (×6): 40 mg via ORAL
  Filled 2017-08-01 (×6): qty 1

## 2017-08-01 MED ORDER — ASPIRIN 81 MG PO CHEW
81.0000 mg | CHEWABLE_TABLET | ORAL | Status: DC
  Administered 2017-08-02 – 2017-08-06 (×3): 81 mg via ORAL
  Filled 2017-08-01 (×3): qty 1

## 2017-08-01 MED ORDER — ONDANSETRON HCL 4 MG PO TABS: 4.0000 mg | ORAL_TABLET | Freq: Four times a day (QID) | ORAL | Status: DC | PRN

## 2017-08-01 MED ORDER — CEFTRIAXONE SODIUM 1 G IJ SOLR
1.0000 g | INTRAMUSCULAR | Status: DC
  Filled 2017-08-01 (×2): qty 10

## 2017-08-01 MED ORDER — ONDANSETRON HCL 4 MG/2ML IJ SOLN: 4.0000 mg | Freq: Four times a day (QID) | INTRAMUSCULAR | Status: DC | PRN

## 2017-08-01 MED ORDER — POTASSIUM CHLORIDE IN NACL 20-0.9 MEQ/L-% IV SOLN
INTRAVENOUS | Status: AC
  Administered 2017-08-01 (×2): via INTRAVENOUS
  Filled 2017-08-01 (×3): qty 1000

## 2017-08-01 MED ORDER — POTASSIUM CHLORIDE CRYS ER 20 MEQ PO TBCR
40.0000 meq | EXTENDED_RELEASE_TABLET | Freq: Once | ORAL | Status: AC
  Administered 2017-08-01: 40 meq via ORAL
  Filled 2017-08-01: qty 2

## 2017-08-01 NOTE — Evaluation (Signed)
Physical Therapy Evaluation Patient Details Name: Karen Larson MRN: [id number] DOB: [birth year] Today's Date: 08/01/2017   History of Present Illness  Pt is a 77 y.o. female with medical history significant for congestive heart failure, COPD, aortic stenosis, and pacemaker and admitted with lethargy, frequent falls, dehydration, hypotension  Clinical Impression  Pt admitted with above diagnosis. Pt currently with functional limitations due to the deficits listed below (see PT Problem List).  Pt will benefit from skilled PT to increase their independence and safety with mobility to allow discharge to the venue listed below.  Pt assisted with short distance ambulation and requiring min-mod assist for mobility at this time.  Pt lives at home alone and her sisters check in on her.  Recommend SNF upon d/c at this time.       Follow Up Recommendations SNF    Equipment Recommendations  Rolling walker with 5" wheels    Recommendations for Other Services       Precautions / Restrictions Precautions Precautions: Fall      Mobility  Bed Mobility Overal bed mobility: Needs Assistance Bed Mobility: Supine to Sit     Supine to sit: Mod assist;HOB elevated     General bed mobility comments: assist for bringing LEs over EOB  Transfers Overall transfer level: Needs assistance Equipment used: Rolling walker (2 wheeled) Transfers: Sit to/from Stand Sit to Stand: Min assist         General transfer comment: light assist for stand and steady, cues for hand placement  Ambulation/Gait Ambulation/Gait assistance: Min assist Ambulation Distance (Feet): 40 Feet Assistive device: Rolling walker (2 wheeled) Gait Pattern/deviations: Step-through pattern;Decreased stride length     General Gait Details: verbal cues for safe use of RW, assist initially for steadying however improved with distance, distance to tolerance  Stairs            Wheelchair Mobility    Modified Rankin  (Stroke Patients Only)       Balance Overall balance assessment: History of Falls;Needs assistance         Standing balance support: Bilateral upper extremity supported Standing balance-Leahy Scale: Poor                               Pertinent Vitals/Pain Pain Assessment: Faces Faces Pain Scale: Hurts even more Pain Location: LEs to touch Pain Descriptors / Indicators: Sore;Grimacing;Tender Pain Intervention(s): Limited activity within patient's tolerance;Repositioned;Monitored during session    Home Living Family/patient expects to be discharged to:: Private residence Living Arrangements: Alone Available Help at Discharge: Family;Available PRN/intermittently Type of Home: House Home Access: Level entry     Home Layout: One level Home Equipment: None      Prior Function Level of Independence: Independent         Comments: for the past few days pt has had poor mobility, falls, required RW, however independent prior to this     Hand Dominance        Extremity/Trunk Assessment        Lower Extremity Assessment Lower Extremity Assessment: Generalized weakness       Communication   Communication: No difficulties  Cognition Arousal/Alertness: Awake/alert Behavior During Therapy: WFL for tasks assessed/performed Overall Cognitive Status: Within Functional Limits for tasks assessed  General Comments      Exercises     Assessment/Plan    PT Assessment Patient needs continued PT services  PT Problem List Decreased strength;Decreased mobility;Decreased activity tolerance;Decreased balance;Decreased knowledge of use of DME       PT Treatment Interventions DME instruction;Therapeutic activities;Gait training;Therapeutic exercise;Patient/family education;Functional mobility training;Balance training    PT Goals (Current goals can be found in the Care Plan section)  Acute Rehab PT  Goals PT Goal Formulation: With patient Time For Goal Achievement: 08/08/17 Potential to Achieve Goals: Good    Frequency Min 3X/week   Barriers to discharge        Co-evaluation               AM-PAC PT "6 Clicks" Daily Activity  Outcome Measure Difficulty turning over in bed (including adjusting bedclothes, sheets and blankets)?: Unable Difficulty moving from lying on back to sitting on the side of the bed? : Unable Difficulty sitting down on and standing up from a chair with arms (e.g., wheelchair, bedside commode, etc,.)?: Unable Help needed moving to and from a bed to chair (including a wheelchair)?: A Little Help needed walking in hospital room?: A Little Help needed climbing 3-5 steps with a railing? : A Lot 6 Click Score: 11    End of Session Equipment Utilized During Treatment: Gait belt Activity Tolerance: Patient tolerated treatment well Patient left: in chair;with chair alarm set;with call bell/phone within reach;with family/visitor present Nurse Communication: Mobility status PT Visit Diagnosis: Difficulty in walking, not elsewhere classified (R26.2);Repeated falls (R29.6)    Time: 1121-6244 PT Time Calculation (min) (ACUTE ONLY): 17 min   Charges:   PT Evaluation $PT Eval Low Complexity: 1 Low     PT G Codes:       Zenovia Jarred, PT, DPT 08/01/2017 Pager: [phone]  Maida Sale E 08/01/2017, 2:32 PM

## 2017-08-01 NOTE — Progress Notes (Addendum)
PROGRESS NOTE    Karen Larson   WUJ:[id number]  DOB: [birth year]  DOA: 07/31/2017 PCP: System, Pcp Not In   Brief Narrative:  Karen Larson 77 y/o female iwho is s/p TAVR, PPM, COPD, hard of hearing who lives alone is sent to the hospital by her sister [name] (who came to check on her) because she fell and could not get up. She states she fell onto the couch and slept on it. A few days before, the same sister found her on the floor after a fall. She tells me she thought she was at her sister's house at that time and was calling out for her but realized later she was at her house. She states she has not been eating or drinking well because she has not been able to get to the refrigerator. She has numerous bruises and scabs on her body. She is not confused.  She admits to wearing depends but is not incontinent. She wears them in case she urinates on herself before she can get up but it has never happened. She notes that the last time she urinated, it burned. She takes her medicines daily but recalls there was one day she missed them.  Her sister states that she lives alone and they have taken her car away. She does not go out at all. She does not cook as she does not have a stove but she has a microwave. Her 3 sisters [name] and [name] are involved in her care. Sisters fill pill box for her.    Subjective: See above.  ROS: no complaints of nausea, vomiting, constipation diarrhea, cough, dyspnea or dysuria. No other complaints.   Assessment & Plan:   Principal Problem: Lethargy- frequent falls Dehydration- hypotension- BP 102/70 on admission - apparently has been sleeping a lot in addition to falling - cont IVF - follow pulse ox to prevent fluid overload in setting of EF of 45% and severe Ao stenosis - Losartan on hold- not on diuretics - PT eval >>> SNF recommended.   Active Problems:    Aortic stenosis- chronic systolic CHF - s/p TAVR in 10/18 - last ECHO in 6/18 >  Mildly dilated LV with EF 45-50%. Wall motion abnormalities as   noted above. Normal RV size and systolic function. Mild pulmonary   hypertension. Severe aortic stenosis. - repeating ECHO - [facility]  COPD - ex smoker- not on inhalers  ? Dementia - sister feels she may have dementia  Depression - takes Lexapro  PPM   DVT prophylaxis: lovenox Code Status: full code Family Communication: sister, [name]  Disposition Plan: cont to hydrate- family prefers [facility] if she needs rehab Consultants:   none Procedures:   none Antimicrobials:  Anti-infectives (From admission, onward)   Start     Dose/Rate Route Frequency Ordered Stop   08/01/17 2200  cefTRIAXone (ROCEPHIN) 1 g in sodium chloride 0.9 % 100 mL IVPB  Status:  Discontinued     1 g 200 mL/hr over 30 Minutes Intravenous Every 24 hours 08/01/17 0003 08/01/17 1302   07/31/17 2300  cefTRIAXone (ROCEPHIN) 1 g in sodium chloride 0.9 % 100 mL IVPB     1 g 200 mL/hr over 30 Minutes Intravenous  Once 07/31/17 2245 07/31/17 2348       Objective: Vitals:   08/01/17 0630 08/01/17 0638 08/01/17 0715 08/01/17 1055  BP: (!) 98/59 (!) 98/59 (!) 101/55 128/68  Pulse:  86 81 87  Resp: 18 18  17 18  Temp:    97.8 F (36.6 C)  TempSrc:    Axillary  SpO2:  97% 97% 96%  Weight:      Height:        Intake/Output Summary (Last 24 hours) at 08/01/2017 1306 Last data filed at 07/31/2017 2348 Gross per 24 hour  Intake 1099.75 ml  Output -  Net 1099.75 ml   Filed Weights   07/31/17 1914  Weight: 51.3 kg (113 lb)    Examination: General exam: Appears comfortable  HEENT: PERRLA, oral mucosa moist, no sclera icterus or thrush Respiratory system: Clear to auscultation. Respiratory effort normal. Cardiovascular system: S1 & S2 heard, RRR.   Gastrointestinal system: Abdomen soft, non-tender, nondistended. Normal bowel sound. No organomegaly Central nervous system: Alert and oriented. No focal  neurological deficits. Extremities: No cyanosis, clubbing or edema Skin: numerous bruises and scabs on extremities.  Psychiatry:  Mood & affect appropriate.     Data Reviewed: I have personally reviewed following labs and imaging studies  CBC: Recent Labs  Lab 07/31/17 1945 08/01/17 0830  WBC 11.0* 8.6  HGB 9.6* 8.7*  HCT 31.2* 28.5*  MCV 85.5 86.1  PLT 316 262   Basic Metabolic Panel: Recent Labs  Lab 07/31/17 1945 08/01/17 0830  NA 142 143  K 3.4* 3.5  CL 111 114*  CO2 22 22  GLUCOSE 111* 94  BUN 30* 23*  CREATININE 0.98 0.81  CALCIUM 8.9 8.3*  MG 2.0  --    GFR: Estimated Creatinine Clearance: 47.9 mL/min (by C-G formula based on SCr of 0.81 mg/dL). Liver Function Tests: No results for input(s): AST, ALT, ALKPHOS, BILITOT, PROT, ALBUMIN in the last 168 hours. No results for input(s): LIPASE, AMYLASE in the last 168 hours. No results for input(s): AMMONIA in the last 168 hours. Coagulation Profile: No results for input(s): INR, PROTIME in the last 168 hours. Cardiac Enzymes: Recent Labs  Lab 07/31/17 2001 08/01/17 0009  CKTOTAL 430*  --   TROPONINI  --  <0.03   BNP (last 3 results) No results for input(s): PROBNP in the last 8760 hours. HbA1C: No results for input(s): HGBA1C in the last 72 hours. CBG: Recent Labs  Lab 07/31/17 1957  GLUCAP 102*   Lipid Profile: No results for input(s): CHOL, HDL, LDLCALC, TRIG, CHOLHDL, LDLDIRECT in the last 72 hours. Thyroid Function Tests: No results for input(s): TSH, T4TOTAL, FREET4, T3FREE, THYROIDAB in the last 72 hours. Anemia Panel: Recent Labs    08/01/17 0830  FERRITIN 45  TIBC 282  IRON 16*   Urine analysis:    Component Value Date/Time   COLORURINE YELLOW 07/31/2017 2201   APPEARANCEUR HAZY (A) 07/31/2017 2201   LABSPEC 1.017 07/31/2017 2201   PHURINE 8.0 07/31/2017 2201   GLUCOSEU NEGATIVE 07/31/2017 2201   HGBUR SMALL (A) 07/31/2017 2201   BILIRUBINUR NEGATIVE 07/31/2017 2201    KETONESUR NEGATIVE 07/31/2017 2201   PROTEINUR 30 (A) 07/31/2017 2201   NITRITE POSITIVE (A) 07/31/2017 2201   LEUKOCYTESUR SMALL (A) 07/31/2017 2201   Sepsis Labs: @LABRCNTIP (procalcitonin:4,lacticidven:4) )No results found for this or any previous visit (from the past 240 hour(s)).       Radiology Studies: Dg Chest 1 View  Result Date: 07/31/2017 CLINICAL DATA:  Fall. EXAM: CHEST  1 VIEW COMPARISON:  December 22, 2016 FINDINGS: Cardiomediastinal silhouette is stable. Stable pacemaker. No pneumothorax. No nodules or masses. No focal infiltrates. No pneumothorax. No visualized fractures. IMPRESSION: No active disease. Electronically Signed   By: Onalee Hua  Judithe Modest M.D   On: 07/31/2017 21:21   Dg Pelvis 1-2 Views  Result Date: 07/31/2017 CLINICAL DATA:  Pain after fall EXAM: PELVIS - 1-2 VIEW COMPARISON:  None. FINDINGS: Dense calcified atherosclerosis in the aorta and iliac vessels. Degenerative changes in the lower lumbar spine. No acute fractures. IMPRESSION: Negative. Electronically Signed   By: Gerome Sam III M.D   On: 07/31/2017 21:22   Ct Head Wo Contrast  Result Date: 07/31/2017 CLINICAL DATA:  Head trauma.  Status post fall. EXAM: CT HEAD WITHOUT CONTRAST CT CERVICAL SPINE WITHOUT CONTRAST TECHNIQUE: Multidetector CT imaging of the head and cervical spine was performed following the standard protocol without intravenous contrast. Multiplanar CT image reconstructions of the cervical spine were also generated. COMPARISON:  None. FINDINGS: CT HEAD FINDINGS Brain: No evidence of acute infarction, hemorrhage, extra-axial collection, ventriculomegaly, or mass effect. Generalized cerebral atrophy. Periventricular white matter low attenuation likely secondary to microangiopathy. Vascular: Cerebrovascular atherosclerotic calcifications are noted. Skull: Negative for fracture or focal lesion. Sinuses/Orbits: Visualized portions of the orbits are unremarkable. Visualized portions of the  paranasal sinuses and mastoid air cells are unremarkable. Other: None. CT CERVICAL SPINE FINDINGS Alignment: 4 mm anterolisthesis of C4 on C5 secondary to facet disease. Skull base and vertebrae: No acute fracture. No primary bone lesion or focal pathologic process. Soft tissues and spinal canal: No prevertebral fluid or swelling. No visible canal hematoma. Disc levels: Degenerative disc disease with disc height loss at C5-6 and C6-7. Moderate right facet arthropathy at C2-3. Moderate left and mild right facet arthropathy at C3-4 with left foraminal stenosis. Broad-based disc osteophyte complex at C4-5. Bilateral uncovertebral degenerative changes at C5-6. Left uncovertebral degenerative changes at C6-7 with foraminal narrowing. Upper chest: Lung apices are clear. Other: No fluid collection or hematoma. IMPRESSION: 1. No acute intracranial pathology. 2.  No acute osseous injury of the cervical spine. Electronically Signed   By: Elige Ko   On: 07/31/2017 21:04   Ct Cervical Spine Wo Contrast  Result Date: 07/31/2017 CLINICAL DATA:  Head trauma.  Status post fall. EXAM: CT HEAD WITHOUT CONTRAST CT CERVICAL SPINE WITHOUT CONTRAST TECHNIQUE: Multidetector CT imaging of the head and cervical spine was performed following the standard protocol without intravenous contrast. Multiplanar CT image reconstructions of the cervical spine were also generated. COMPARISON:  None. FINDINGS: CT HEAD FINDINGS Brain: No evidence of acute infarction, hemorrhage, extra-axial collection, ventriculomegaly, or mass effect. Generalized cerebral atrophy. Periventricular white matter low attenuation likely secondary to microangiopathy. Vascular: Cerebrovascular atherosclerotic calcifications are noted. Skull: Negative for fracture or focal lesion. Sinuses/Orbits: Visualized portions of the orbits are unremarkable. Visualized portions of the paranasal sinuses and mastoid air cells are unremarkable. Other: None. CT CERVICAL SPINE  FINDINGS Alignment: 4 mm anterolisthesis of C4 on C5 secondary to facet disease. Skull base and vertebrae: No acute fracture. No primary bone lesion or focal pathologic process. Soft tissues and spinal canal: No prevertebral fluid or swelling. No visible canal hematoma. Disc levels: Degenerative disc disease with disc height loss at C5-6 and C6-7. Moderate right facet arthropathy at C2-3. Moderate left and mild right facet arthropathy at C3-4 with left foraminal stenosis. Broad-based disc osteophyte complex at C4-5. Bilateral uncovertebral degenerative changes at C5-6. Left uncovertebral degenerative changes at C6-7 with foraminal narrowing. Upper chest: Lung apices are clear. Other: No fluid collection or hematoma. IMPRESSION: 1. No acute intracranial pathology. 2.  No acute osseous injury of the cervical spine. Electronically Signed   By: Elige Ko   On:  07/31/2017 21:04   Dg Foot Complete Left  Result Date: 07/31/2017 CLINICAL DATA:  Patient with pain to the anterior foot status post fall. Initial encounter. EXAM: LEFT FOOT - COMPLETE 3+ VIEW COMPARISON:  None. FINDINGS: Normal anatomic alignment. Midfoot degenerative changes. First MTP joint degenerative changes. No evidence for acute fracture or dislocation. IMPRESSION: No acute osseous abnormality.  Degenerative changes. Electronically Signed   By: Annia Belt M.D.   On: 07/31/2017 21:23      Scheduled Meds: . [START ON 08/02/2017] aspirin  81 mg Oral QODAY  . atorvastatin  40 mg Oral Daily  . enoxaparin (LOVENOX) injection  40 mg Subcutaneous Q24H  . escitalopram  20 mg Oral Daily   Continuous Infusions: . 0.9 % NaCl with KCl 20 mEq / L 100 mL/hr at 08/01/17 0835     LOS: 0 days    Time spent in minutes: 35    Calvert Cantor, MD [facility] Pager: [email or web address] Password [email or web address] 08/01/2017, 1:06 PM [facility]

## 2017-08-01 NOTE — Progress Notes (Signed)
  Echocardiogram 2D Echocardiogram has been performed.  Karen Larson 08/01/2017, 3:56 PM

## 2017-08-02 DIAGNOSIS — R531 Weakness: Secondary | ICD-10-CM

## 2017-08-02 DIAGNOSIS — J449 Chronic obstructive pulmonary disease, unspecified: Secondary | ICD-10-CM

## 2017-08-02 DIAGNOSIS — F0390 Unspecified dementia without behavioral disturbance: Secondary | ICD-10-CM

## 2017-08-02 LAB — URINE CULTURE

## 2017-08-02 LAB — BASIC METABOLIC PANEL
Anion gap: 4 — ABNORMAL LOW (ref 5–15)
BUN: 15 mg/dL (ref 6–20)
CALCIUM: 8.1 mg/dL — AB (ref 8.9–10.3)
CHLORIDE: 112 mmol/L — AB (ref 101–111)
CO2: 22 mmol/L (ref 22–32)
CREATININE: 0.78 mg/dL (ref 0.44–1.00)
GFR calc Af Amer: >60 mL/min (ref >60)
GFR calc non Af Amer: >60 mL/min (ref >60)
GLUCOSE: 97 mg/dL (ref 65–99)
Potassium: 3.7 mmol/L (ref 3.5–5.1)
Sodium: 138 mmol/L (ref 135–145)

## 2017-08-02 NOTE — NC FL2 (Signed)
   MEDICAID FL2 LEVEL OF CARE SCREENING TOOL     IDENTIFICATION  Patient Name: Karen Larson Birthdate: 04/24/1940 Sex: female Admission Date (Current Location): 07/31/2017  [facility] and [place] Number:  Geophysical data processor and Address:  [facility],  [address], [place] [address]      Provider Number: [id number]  Attending Physician Name and Address:  Calvert Cantor, MD  Relative Name and Phone Number:       Current Level of Care: Hospital Recommended Level of Care: Skilled Nursing Facility Prior Approval Number:    Date Approved/Denied:   PASRR Number: [id number]  Discharge Plan: SNF    Current Diagnoses: Patient Active Problem List   Diagnosis Date Noted  . Dehydration 08/01/2017  . Altered mental status 07/31/2017  . Protein-calorie malnutrition, severe 08/09/2016  . Hypokalemia 08/08/2016  . Hypokalemia due to loss of potassium 08/08/2016  . Dyspnea on exertion 08/08/2016  . Chronic obstructive pulmonary disease (HCC) 08/07/2016  . Congestive heart failure (HCC) 08/07/2016  . Aortic stenosis 10/29/2012  . Cardiomyopathy (HCC) 10/29/2012  . CHF (congestive heart failure) (HCC) 08/18/2012    Orientation RESPIRATION BLADDER Height & Weight     Self, Time, Situation, Place  Normal Continent Weight: 113 lb (51.3 kg) Height:  5' 6.5" (168.9 cm)  BEHAVIORAL SYMPTOMS/MOOD NEUROLOGICAL BOWEL NUTRITION STATUS      Continent Diet(Heart Healthy )  AMBULATORY STATUS COMMUNICATION OF NEEDS Skin   Extensive Assist   (Pressure Injury Sacrum)                       Personal Care Assistance Level of Assistance  Bathing, Feeding, Dressing Bathing Assistance: Limited assistance Feeding assistance: Independent       Functional Limitations Info  Sight, Hearing, Speech Sight Info: Adequate Hearing Info: Adequate Speech Info: Adequate    SPECIAL CARE FACTORS FREQUENCY  PT (By licensed PT), OT (By licensed OT)     PT Frequency:  5x/week OT Frequency: 5x/week            Contractures Contractures Info: Not present    Additional Factors Info  Code Status, Allergies, Psychotropic Code Status Info: Fullcode Allergies Info: Tiotropium Bromide Monohydrate, Doxycycline, Levaquin  Levofloxacin Psychotropic Info: Lexapro         Current Medications (08/02/2017):  This is the current hospital active medication list Current Facility-Administered Medications  Medication Dose Route Frequency Provider Last Rate Last Dose  . aspirin chewable tablet 81 mg  81 mg Oral QODAY Emokpae, Ejiroghene E, MD   81 mg at 08/02/17 0924  . atorvastatin (LIPITOR) tablet 40 mg  40 mg Oral Daily Emokpae, Ejiroghene E, MD   40 mg at 08/02/17 0924  . enoxaparin (LOVENOX) injection 40 mg  40 mg Subcutaneous Q24H Emokpae, Ejiroghene E, MD      . escitalopram (LEXAPRO) tablet 20 mg  20 mg Oral Daily Emokpae, Ejiroghene E, MD   20 mg at 08/02/17 0924  . ondansetron (ZOFRAN) tablet 4 mg  4 mg Oral Q6H PRN Emokpae, Ejiroghene E, MD       Or  . ondansetron (ZOFRAN) injection 4 mg  4 mg Intravenous Q6H PRN Emokpae, Ejiroghene E, MD         Discharge Medications: Please see discharge summary for a list of discharge medications.  Relevant Imaging Results:  Relevant Lab Results:   Additional Information ssn:[phone]  Clearance Coots, LCSW

## 2017-08-02 NOTE — Clinical Social Work Note (Signed)
Clinical Social Work Assessment  Patient Details  Name: Karen Larson MRN: [id number] Date of Birth: 13-Sep-1940  Date of referral:  08/02/17               Reason for consult:  Facility Placement                Permission sought to share information with:  Facility Sport and exercise psychologist, Family Supports, Case Manager Permission granted to share information::  Yes, Verbal Permission Granted  Name::        [facility]   Agency::  SNF  Relationship::   Sister  Contact Information:   [phone]  Housing/Transportation Living arrangements for the past 2 months:  [place] of Information:  Patient Patient Interpreter Needed:  None Criminal Activity/Legal Involvement Pertinent to Current Situation/Hospitalization:  No - Comment as needed Significant Relationships:  Siblings Lives with:  Self Do you feel safe going back to the place where you live?  Yes Need for family participation in patient care:  Yes (Dependent with mobility and care)  Care giving concerns:   Patient lives alone and requiring additional physical therapy before she returns home. Patient is weak and unable to stand without assistance.   Social Worker assessment / plan:  CSW met with patient at bedside to discuss discharge plans. Patient reports she is agreeable to SNF placement for rehab. The patient reports she went to SNF in December for rehab placement after heart surgery. Patient reports she prefers to go to a facility close to her home.   Patient states she live in single family home alone.  She states she is fairly independent, uses a walker, bathes and feeds self. She reports her sister Karen Larson assist with grocery shopping, cleaning and doctor appointments.  CSW explain SNF process and 3 night qualifying stay for medicare to help cover cost for SNF stay. Patient reports understanding.  CSW updated sister Karen Larson via phone. She plans to transport patient at discharge.   Employment status:   Retired Forensic scientist:  Medicare PT Recommendations:  [place] / Referral to community resources:  [place]  Patient/Family's Response to care:  Agreeable and Responding well to care  Patient/Family's Understanding of and Emotional Response to Diagnosis, Current Treatment, and Prognosis: Patient presented calm during assessment. She has a basic understanding of her diagnosis. Patient sister Karen Larson is very involved in patient care.  Emotional Assessment Appearance:  Appears stated age Attitude/Demeanor/Rapport:    Affect (typically observed):  Accepting, Calm Orientation:  Oriented to Self, Oriented to Place, Oriented to  Time, Oriented to Situation Alcohol / Substance use:  Not Applicable Psych involvement (Current and /or in the community):  No (Comment)  Discharge Needs  Concerns to be addressed:  Discharge Planning Concerns Readmission within the last 30 days:  No Current discharge risk:  Dependent with Mobility Barriers to Discharge:  [place], [place] 08/02/2017, 10:12 AM

## 2017-08-02 NOTE — Progress Notes (Signed)
Physical Therapy Treatment Patient Details Name: Karen Larson MRN: [id number] DOB: [birth year] Today's Date: 08/02/2017    History of Present Illness Pt is a 77 y.o. female with medical history significant for congestive heart failure, COPD, aortic stenosis, and pacemaker and admitted with lethargy, frequent falls, dehydration, hypotension    PT Comments    Pt continues to require min-mod assist for mobility at this time.  Continue to recommend SNF upon d/c.  Follow Up Recommendations  SNF     Equipment Recommendations  Rolling walker with 5" wheels    Recommendations for Other Services       Precautions / Restrictions Precautions Precautions: Fall    Mobility  Bed Mobility Overal bed mobility: Needs Assistance Bed Mobility: Supine to Sit     Supine to sit: Mod assist;HOB elevated     General bed mobility comments: assist for bringing LEs over EOB  Transfers Overall transfer level: Needs assistance Equipment used: Rolling walker (2 wheeled) Transfers: Sit to/from Stand Sit to Stand: Min assist         General transfer comment: pt unable to stand from lower surface, required elevated bed and min assist for stand and steady, cues for hand placement  Ambulation/Gait Ambulation/Gait assistance: Min assist Ambulation Distance (Feet): 40 Feet Assistive device: Rolling walker (2 wheeled) Gait Pattern/deviations: Step-through pattern;Decreased stride length     General Gait Details: verbal cues for safe use of RW, assist for steadying. distance per pt tolerance   Stairs             Wheelchair Mobility    Modified Rankin (Stroke Patients Only)       Balance                                            Cognition Arousal/Alertness: Awake/alert Behavior During Therapy: WFL for tasks assessed/performed Overall Cognitive Status: Within Functional Limits for tasks assessed                                         Exercises      General Comments        Pertinent Vitals/Pain Pain Assessment: Faces Faces Pain Scale: Hurts little more Pain Location: LEs to touch Pain Descriptors / Indicators: Sore;Grimacing;Tender Pain Intervention(s): Repositioned;Monitored during session    Home Living                      Prior Function            PT Goals (current goals can now be found in the care plan section) Progress towards PT goals: Progressing toward goals    Frequency    Min 3X/week      PT Plan Current plan remains appropriate    Co-evaluation              AM-PAC PT "6 Clicks" Daily Activity  Outcome Measure  Difficulty turning over in bed (including adjusting bedclothes, sheets and blankets)?: Unable Difficulty moving from lying on back to sitting on the side of the bed? : Unable Difficulty sitting down on and standing up from a chair with arms (e.g., wheelchair, bedside commode, etc,.)?: Unable Help needed moving to and from a bed to chair (including a wheelchair)?: A Little Help needed walking in hospital room?:  A Little Help needed climbing 3-5 steps with a railing? : A Lot 6 Click Score: 11    End of Session Equipment Utilized During Treatment: Gait belt Activity Tolerance: Patient tolerated treatment well Patient left: in chair;with chair alarm set;with call bell/phone within reach;with nursing/sitter in room Nurse Communication: Mobility status PT Visit Diagnosis: Difficulty in walking, not elsewhere classified (R26.2);Repeated falls (R29.6)     Time: 1610-9604 PT Time Calculation (min) (ACUTE ONLY): 17 min  Charges:  $Gait Training: 8-22 mins                    G Codes:      Zenovia Jarred, PT, DPT 08/02/2017 Pager: [phone]  Maida Sale E 08/02/2017, 1:28 PM

## 2017-08-02 NOTE — Progress Notes (Signed)
Initial Nutrition Assessment  DOCUMENTATION CODES:   Underweight, Non-severe (moderate) malnutrition in context of social or environmental circumstances  INTERVENTION:  RD to monitor PO intake as pt declined any supplementation or MVI.    NUTRITION DIAGNOSIS:   Moderate Malnutrition related to social / environmental circumstances as evidenced by percent weight loss, moderate fat depletion, moderate muscle depletion.  GOAL:   Patient will meet greater than or equal to 90% of their needs  MONITOR:   PO intake, Skin, Weight trends, Labs, I & O's  REASON FOR ASSESSMENT:   (PI present)    ASSESSMENT:   77 y.o. F admitted on 07/31/17 for altered mental status with metabolic encephalopathy secondary to dehydration and UTI. PMH of CHF, COPD, aortic stenosis, and cardiomyopathy.    Per chart review pt ate 60% of meal eaten yesterday.  No new measured weight for pt.   Pt reports that she went from her UBW of 130 lbs down to 100 lbs when she was in a nursing home around November-December of 2018 (brother specified timeframe) - a significant weight loss for timeframe (23%); pt now weighs 113 lbs "my doctor was tickled that I gained weight" 13% total weight loss in 6 months - significant. Pt reports this weight loss is due to her not eating at the nursing home.   Per chart review and pt report pt is hungry and eating 100% of her meals. Pt reported no being able to get to the food due to having fallen when asked how she was eating at home. Pt reports that before she fell she will typically have three meals a day and eat "whatever I want", pt reports eating that way for "a very long time". Pt reports that for breakfast she will have a "big bowel of cereal" and would have a sandwich for both lunch and dinner.  Pt declines any supplementation and MVI.   Medications reviewed: lexapro  Labs reviewed: Cl 114 (H), BUN 23 (H), iron 16 (L), RBC 3.31 (L), hemoglobin 8.7 (L), HCT 28.5 (L).    NUTRITION - FOCUSED PHYSICAL EXAM:    Most Recent Value  Orbital Region  Mild depletion  Upper Arm Region  Severe depletion  Thoracic and Lumbar Region  Mild depletion  Buccal Region  No depletion  Temple Region  Mild depletion  Clavicle Bone Region  Severe depletion  Clavicle and Acromion Bone Region  Moderate depletion  Scapular Bone Region  Unable to assess  Dorsal Hand  Moderate depletion  Patellar Region  Moderate depletion  Anterior Thigh Region  Moderate depletion  Posterior Calf Region  Moderate depletion  Edema (RD Assessment)  None  Hair  Reviewed  Eyes  Reviewed  Mouth  Reviewed  Skin  Reviewed  Nails  Unable to assess [nail polish]       Diet Order:   Diet Order           Diet Heart Room service appropriate? Yes; Fluid consistency: Thin  Diet effective now          EDUCATION NEEDS:   Education needs have been addressed  Skin:  Skin Assessment: Skin Integrity Issues: Skin Integrity Issues:: Other (Comment) Other: Pressure injury to sacrum - no stage reported  Last BM:  07/30/17  Height:   Ht Readings from Last 1 Encounters:  07/31/17 5' 6.5" (1.689 m)    Weight:   Wt Readings from Last 1 Encounters:  07/31/17 113 lb (51.3 kg)    Ideal Body Weight:  60.23 kg  BMI:  Body mass index is 17.97 kg/m.  Estimated Nutritional Needs:   Kcal:  1450-1650 kcal  Protein:  70-85 grams  Fluid:  >/= 1.3 L   Sherrine Maples, Dietetic Intern

## 2017-08-02 NOTE — Clinical Social Work Placement (Addendum)
Patient and sister only agreeable to [facility]. The facility does not accept new patient on weekend due to inability to order appropriate med's over weekend. Patient sister agreeable to take patient home and transition to [facility] on Monday if d/c on weekend.   CLINICAL SOCIAL WORK PLACEMENT  NOTE  Date:  08/02/2017  Patient Details  Name: Karen Larson MRN: [id number] Date of Birth: February 22, 1941  Clinical Social Work is seeking post-discharge placement for this patient at the Skilled  Nursing Facility level of care (*CSW will initial, date and re-position this form in  chart as items are completed):  Yes   Patient/family provided with [facility] Social Work Department's list of facilities offering this level of care within the geographic area requested by the patient (or if unable, by the patient's family).  Yes   Patient/family informed of their freedom to choose among providers that offer the needed level of care, that participate in Medicare, Medicaid or managed care program needed by the patient, have an available bed and are willing to accept the patient.  Yes   Patient/family informed of 's ownership interest in [facility] and [facility], as well as of the fact that they are under no obligation to receive care at these facilities.  PASRR submitted to EDS on       PASRR number received on       Existing PASRR number confirmed on 08/02/17     FL2 transmitted to all facilities in geographic area requested by pt/family on       FL2 transmitted to all facilities within larger geographic area on       Patient informed that his/her managed care company has contracts with or will negotiate with certain facilities, including the following:  [employer] informed of bed offers received.  Patient chooses bed at [facility]     Physician recommends and patient chooses bed at      Patient to be transferred to  [facility] on  .  Patient to be transferred to facility by [facility]     Patient family notified on   of transfer.  Name of family member notified:        PHYSICIAN       Additional Comment:    _______________________________________________ Clearance Coots, LCSW 08/02/2017, 2:00 PM

## 2017-08-02 NOTE — Progress Notes (Addendum)
PROGRESS NOTE    Karen Larson   ZOX:[id number]  DOB: Jan 01, 1941  DOA: 07/31/2017 PCP: System, Pcp Not In   Brief Narrative:  Karen Larson 77 y/o female iwho is s/p TAVR, PPM, COPD, hard of hearing who lives alone is sent to the hospital by her sister Karen Larson (who came to check on her) because she fell and could not get up. She states she fell onto the couch and slept on it. A few days before, the same sister found her on the floor after a fall. She tells me she thought she was at her sister's house at that time and was calling out for her but realized later she was at her house. She states she has not been eating or drinking well because she has not been able to get to the refrigerator. She has numerous bruises and scabs on her body. She is not confused.  She admits to wearing depends but is not incontinent. She wears them in case she urinates on herself before she can get up but it has never happened. She notes that the last time she urinated, it burned. She takes her medicines daily but recalls there was one day she missed them.  Her sister states that she lives alone and they have taken her car away. She does not go out at all. She does not cook as she does not have a stove but she has a microwave. Her 3 sisters [name] and [name] are involved in her care. Sisters fill pill box for her.    Subjective: No nausea, vomiting, abdominal pain. Has not been out of bed yet today.   Assessment & Plan:   Principal Problem: Lethargy- frequent falls Dehydration- hypotension- BP 102/70 on admission - apparently has been sleeping a lot in addition to falling - cont IVF - follow pulse ox to prevent fluid overload in setting of EF of 45% and severe Ao stenosis - not on diuretics - Losartan is on hold and should no be resumed as BP still is low  - PT eval >>> SNF recommended.   Active Problems:    Aortic stenosis- chronic systolic CHF - s/p TAVR in 10/18 - last ECHO in 6/18 > Mildly  dilated LV with EF 45-50%. Wall motion abnormalities as   noted above. Normal RV size and systolic function. Mild pulmonary   hypertension. Severe aortic stenosis. - ECHO below is the same as prior - cardiologist at [facility]  COPD - ex smoker- not on inhalers- not wheezing   Dementia - sister feels she may have dementia  Depression - takes Lexapro  PPM   DVT prophylaxis: lovenox Code Status: full code Family Communication: sister, [name]  Disposition Plan:  - family prefers [facility] - likely will d/c tomorrow Consultants:   none Procedures:   2 D ECHO Left ventricle: The cavity size was normal. Systolic function was   mildly reduced. The estimated ejection fraction was in the range   of 45% to 50%. Hypokinesis of the basal anteroseptal and anterior   myocardium. Doppler parameters are consistent with abnormal left   ventricular relaxation (grade 1 diastolic dysfunction). - Aortic valve: Transvalvular velocity was within the normal range.   There was no stenosis. There was no regurgitation. Valve area   (VTI): 1.96 cm^2. Valve area (Vmax): 1.84 cm^2. Valve area   (Vmean): 1.72 cm^2. - Mitral valve: Transvalvular velocity was within the normal range.   There was no evidence for stenosis. There was trivial  regurgitation. - Right ventricle: The cavity size was normal. Wall thickness was   normal. Systolic function was normal. - Atrial septum: No defect or patent foramen ovale was identified. - Tricuspid valve: There was mild-moderate regurgitation. - Pulmonary arteries: Systolic pressure was within the normal   range. PA peak pressure: 37 mm Hg (S). Antimicrobials:  Anti-infectives (From admission, onward)   Start     Dose/Rate Route Frequency Ordered Stop   08/01/17 2200  cefTRIAXone (ROCEPHIN) 1 g in sodium chloride 0.9 % 100 mL IVPB  Status:  Discontinued     1 g 200 mL/hr over 30 Minutes Intravenous Every 24 hours 08/01/17 0003 08/01/17 1302    07/31/17 2300  cefTRIAXone (ROCEPHIN) 1 g in sodium chloride 0.9 % 100 mL IVPB     1 g 200 mL/hr over 30 Minutes Intravenous  Once 07/31/17 2245 07/31/17 2348       Objective: Vitals:   08/01/17 1055 08/01/17 1319 08/01/17 2231 08/02/17 0525  BP: 128/68 (!) 119/58 106/61 (!) 103/59  Pulse: 87 86 (!) 115 99  Resp: 18 14 16 16   Temp: 97.8 F (36.6 C) (!) 97.5 F (36.4 C) 97.9 F (36.6 C) 98.3 F (36.8 C)  TempSrc: Axillary Oral Oral Oral  SpO2: 96% 97% 97% 96%  Weight:      Height:        Intake/Output Summary (Last 24 hours) at 08/02/2017 0714 Last data filed at 08/02/2017 0526 Gross per 24 hour  Intake 1445 ml  Output 3200 ml  Net -1755 ml   Filed Weights   07/31/17 1914  Weight: 51.3 kg (113 lb)    Examination: General exam: Appears comfortable  HEENT: PERRLA, oral mucosa moist, no sclera icterus or thrush Respiratory system: Clear to auscultation. Respiratory effort normal. Cardiovascular system: S1 & S2 heard, RRR.   Gastrointestinal system: Abdomen soft, non-tender, nondistended. Normal bowel sound. No organomegaly Central nervous system: Alert and oriented. No focal neurological deficits. Extremities: No cyanosis, clubbing or edema Skin: numerous bruises and scabs on extremities.  Psychiatry:  Mood & affect appropriate.     Data Reviewed: I have personally reviewed following labs and imaging studies  CBC: Recent Labs  Lab 07/31/17 1945 08/01/17 0830  WBC 11.0* 8.6  HGB 9.6* 8.7*  HCT 31.2* 28.5*  MCV 85.5 86.1  PLT 316 262   Basic Metabolic Panel: Recent Labs  Lab 07/31/17 1945 08/01/17 0830 08/02/17 0615  NA 142 143 138  K 3.4* 3.5 3.7  CL 111 114* 112*  CO2 22 22 22   GLUCOSE 111* 94 97  BUN 30* 23* 15  CREATININE 0.98 0.81 0.78  CALCIUM 8.9 8.3* 8.1*  MG 2.0  --   --    GFR: Estimated Creatinine Clearance: 48.5 mL/min (by C-G formula based on SCr of 0.78 mg/dL). Liver Function Tests: No results for input(s): AST, ALT, ALKPHOS,  BILITOT, PROT, ALBUMIN in the last 168 hours. No results for input(s): LIPASE, AMYLASE in the last 168 hours. No results for input(s): AMMONIA in the last 168 hours. Coagulation Profile: No results for input(s): INR, PROTIME in the last 168 hours. Cardiac Enzymes: Recent Labs  Lab 07/31/17 2001 08/01/17 0009  CKTOTAL 430*  --   TROPONINI  --  <0.03   BNP (last 3 results) No results for input(s): PROBNP in the last 8760 hours. HbA1C: No results for input(s): HGBA1C in the last 72 hours. CBG: Recent Labs  Lab 07/31/17 1957  GLUCAP 102*   Lipid Profile: No results  for input(s): CHOL, HDL, LDLCALC, TRIG, CHOLHDL, LDLDIRECT in the last 72 hours. Thyroid Function Tests: No results for input(s): TSH, T4TOTAL, FREET4, T3FREE, THYROIDAB in the last 72 hours. Anemia Panel: Recent Labs    08/01/17 0830  FERRITIN 45  TIBC 282  IRON 16*   Urine analysis:    Component Value Date/Time   COLORURINE YELLOW 07/31/2017 2201   APPEARANCEUR HAZY (A) 07/31/2017 2201   LABSPEC 1.017 07/31/2017 2201   PHURINE 8.0 07/31/2017 2201   GLUCOSEU NEGATIVE 07/31/2017 2201   HGBUR SMALL (A) 07/31/2017 2201   BILIRUBINUR NEGATIVE 07/31/2017 2201   KETONESUR NEGATIVE 07/31/2017 2201   PROTEINUR 30 (A) 07/31/2017 2201   NITRITE POSITIVE (A) 07/31/2017 2201   LEUKOCYTESUR SMALL (A) 07/31/2017 2201   Sepsis Labs: @LABRCNTIP (procalcitonin:4,lacticidven:4) )No results found for this or any previous visit (from the past 240 hour(s)).       Radiology Studies: Dg Chest 1 View  Result Date: 07/31/2017 CLINICAL DATA:  Fall. EXAM: CHEST  1 VIEW COMPARISON:  December 22, 2016 FINDINGS: Cardiomediastinal silhouette is stable. Stable pacemaker. No pneumothorax. No nodules or masses. No focal infiltrates. No pneumothorax. No visualized fractures. IMPRESSION: No active disease. Electronically Signed   By: Gerome Sam III M.D   On: 07/31/2017 21:21   Dg Pelvis 1-2 Views  Result Date:  07/31/2017 CLINICAL DATA:  Pain after fall EXAM: PELVIS - 1-2 VIEW COMPARISON:  None. FINDINGS: Dense calcified atherosclerosis in the aorta and iliac vessels. Degenerative changes in the lower lumbar spine. No acute fractures. IMPRESSION: Negative. Electronically Signed   By: Gerome Sam III M.D   On: 07/31/2017 21:22   Ct Head Wo Contrast  Result Date: 07/31/2017 CLINICAL DATA:  Head trauma.  Status post fall. EXAM: CT HEAD WITHOUT CONTRAST CT CERVICAL SPINE WITHOUT CONTRAST TECHNIQUE: Multidetector CT imaging of the head and cervical spine was performed following the standard protocol without intravenous contrast. Multiplanar CT image reconstructions of the cervical spine were also generated. COMPARISON:  None. FINDINGS: CT HEAD FINDINGS Brain: No evidence of acute infarction, hemorrhage, extra-axial collection, ventriculomegaly, or mass effect. Generalized cerebral atrophy. Periventricular white matter low attenuation likely secondary to microangiopathy. Vascular: Cerebrovascular atherosclerotic calcifications are noted. Skull: Negative for fracture or focal lesion. Sinuses/Orbits: Visualized portions of the orbits are unremarkable. Visualized portions of the paranasal sinuses and mastoid air cells are unremarkable. Other: None. CT CERVICAL SPINE FINDINGS Alignment: 4 mm anterolisthesis of C4 on C5 secondary to facet disease. Skull base and vertebrae: No acute fracture. No primary bone lesion or focal pathologic process. Soft tissues and spinal canal: No prevertebral fluid or swelling. No visible canal hematoma. Disc levels: Degenerative disc disease with disc height loss at C5-6 and C6-7. Moderate right facet arthropathy at C2-3. Moderate left and mild right facet arthropathy at C3-4 with left foraminal stenosis. Broad-based disc osteophyte complex at C4-5. Bilateral uncovertebral degenerative changes at C5-6. Left uncovertebral degenerative changes at C6-7 with foraminal narrowing. Upper chest: Lung  apices are clear. Other: No fluid collection or hematoma. IMPRESSION: 1. No acute intracranial pathology. 2.  No acute osseous injury of the cervical spine. Electronically Signed   By: Elige Ko   On: 07/31/2017 21:04   Ct Cervical Spine Wo Contrast  Result Date: 07/31/2017 CLINICAL DATA:  Head trauma.  Status post fall. EXAM: CT HEAD WITHOUT CONTRAST CT CERVICAL SPINE WITHOUT CONTRAST TECHNIQUE: Multidetector CT imaging of the head and cervical spine was performed following the standard protocol without intravenous contrast. Multiplanar CT image reconstructions of  the cervical spine were also generated. COMPARISON:  None. FINDINGS: CT HEAD FINDINGS Brain: No evidence of acute infarction, hemorrhage, extra-axial collection, ventriculomegaly, or mass effect. Generalized cerebral atrophy. Periventricular white matter low attenuation likely secondary to microangiopathy. Vascular: Cerebrovascular atherosclerotic calcifications are noted. Skull: Negative for fracture or focal lesion. Sinuses/Orbits: Visualized portions of the orbits are unremarkable. Visualized portions of the paranasal sinuses and mastoid air cells are unremarkable. Other: None. CT CERVICAL SPINE FINDINGS Alignment: 4 mm anterolisthesis of C4 on C5 secondary to facet disease. Skull base and vertebrae: No acute fracture. No primary bone lesion or focal pathologic process. Soft tissues and spinal canal: No prevertebral fluid or swelling. No visible canal hematoma. Disc levels: Degenerative disc disease with disc height loss at C5-6 and C6-7. Moderate right facet arthropathy at C2-3. Moderate left and mild right facet arthropathy at C3-4 with left foraminal stenosis. Broad-based disc osteophyte complex at C4-5. Bilateral uncovertebral degenerative changes at C5-6. Left uncovertebral degenerative changes at C6-7 with foraminal narrowing. Upper chest: Lung apices are clear. Other: No fluid collection or hematoma. IMPRESSION: 1. No acute intracranial  pathology. 2.  No acute osseous injury of the cervical spine. Electronically Signed   By: Elige Ko   On: 07/31/2017 21:04   Dg Foot Complete Left  Result Date: 07/31/2017 CLINICAL DATA:  Patient with pain to the anterior foot status post fall. Initial encounter. EXAM: LEFT FOOT - COMPLETE 3+ VIEW COMPARISON:  None. FINDINGS: Normal anatomic alignment. Midfoot degenerative changes. First MTP joint degenerative changes. No evidence for acute fracture or dislocation. IMPRESSION: No acute osseous abnormality.  Degenerative changes. Electronically Signed   By: Annia Belt M.D.   On: 07/31/2017 21:23      Scheduled Meds: . aspirin  81 mg Oral QODAY  . atorvastatin  40 mg Oral Daily  . enoxaparin (LOVENOX) injection  40 mg Subcutaneous Q24H  . escitalopram  20 mg Oral Daily   Continuous Infusions:    LOS: 1 day    Time spent in minutes: 35    Calvert Cantor, MD [facility] Pager: [email or web address] Password [facility] 08/02/2017, 7:14 AM [facility]

## 2017-08-03 DIAGNOSIS — E440 Moderate protein-calorie malnutrition: Secondary | ICD-10-CM

## 2017-08-03 NOTE — Progress Notes (Signed)
PROGRESS NOTE    [name]   ZOX:[id number]  DOB: March 17, 1940  DOA: 07/31/2017 PCP: System, Pcp Not In   Brief Narrative:  Karen Larson 77 y/o female iwho is s/p TAVR, PPM, COPD, hard of hearing who lives alone is sent to the hospital by her sister Karen Larson (who came to check on her) because she fell and could not get up. She states she fell onto the couch and slept on it. A few days before, the same sister found her on the floor after a fall. She tells me she thought she was at her sister's house at that time and was calling out for her but realized later she was at her house. She states she has not been eating or drinking well because she has not been able to get to the refrigerator. She has numerous bruises and scabs on her body. She is not confused.  She admits to wearing depends but is not incontinent. She wears them in case she urinates on herself before she can get up but it has never happened. She notes that the last time she urinated, it burned. She takes her medicines daily but recalls there was one day she missed them.  Her sister states that she lives alone and they have taken her car away. She does not go out at all. She does not cook as she does not have a stove but she has a microwave. Her 3 sisters Karen Larson and Karen Larson are involved in her care. Sisters fill pill box for her.    Subjective: Having pain in bottom and left leg. Improved after repositioning. No other complaints.   Assessment & Plan:   Principal Problem: Lethargy- frequent falls Dehydration- hypotension- BP 102/70 on admission - apparently has been sleeping a lot in addition to falling - cont IVF - follow pulse ox to prevent fluid overload in setting of EF of 45% and severe Ao stenosis - not on diuretics - Losartan is on hold and should no be resumed as BP still is low  - PT eval >>> SNF recommended.   Active Problems:    Aortic stenosis- chronic systolic CHF - s/p TAVR in 10/18 - last ECHO in  6/18 > Mildly dilated LV with EF 45-50%. Wall motion abnormalities as   noted above. Normal RV size and systolic function. Mild pulmonary   hypertension. Severe aortic stenosis. - ECHO below is the same as prior - cardiologist at [facility]  COPD - ex smoker- not on inhalers- not wheezing   Dementia - sister feels she may have dementia  Depression - takes Lexapro  PPM   DVT prophylaxis: lovenox Code Status: full code Family Communication: sister, Karen Larson and sister Karen Larson [place] today Disposition Plan:  - family prefers [facility] [place] - will d/c Monday  Consultants:   none Procedures:   2 D ECHO Left ventricle: The cavity size was normal. Systolic function was   mildly reduced. The estimated ejection fraction was in the range   of 45% to 50%. Hypokinesis of the basal anteroseptal and anterior   myocardium. Doppler parameters are consistent with abnormal left   ventricular relaxation (grade 1 diastolic dysfunction). - Aortic valve: Transvalvular velocity was within the normal range.   There was no stenosis. There was no regurgitation. Valve area   (VTI): 1.96 cm^2. Valve area (Vmax): 1.84 cm^2. Valve area   (Vmean): 1.72 cm^2. - Mitral valve: Transvalvular velocity was within the normal range.   There was no evidence for stenosis.  There was trivial   regurgitation. - Right ventricle: The cavity size was normal. Wall thickness was   normal. Systolic function was normal. - Atrial septum: No defect or patent foramen ovale was identified. - Tricuspid valve: There was mild-moderate regurgitation. - Pulmonary arteries: Systolic pressure was within the normal   range. PA peak pressure: 37 mm Hg (S). Antimicrobials:  Anti-infectives (From admission, onward)   Start     Dose/Rate Route Frequency Ordered Stop   08/01/17 2200  cefTRIAXone (ROCEPHIN) 1 g in sodium chloride 0.9 % 100 mL IVPB  Status:  Discontinued     1 g 200 mL/hr over 30 Minutes Intravenous Every 24 hours  08/01/17 0003 08/01/17 1302   07/31/17 2300  cefTRIAXone (ROCEPHIN) 1 g in sodium chloride 0.9 % 100 mL IVPB     1 g 200 mL/hr over 30 Minutes Intravenous  Once 07/31/17 2245 07/31/17 2348       Objective: Vitals:   08/02/17 1542 08/02/17 2206 08/03/17 0606 08/03/17 1409  BP: (!) 100/54 124/67 121/62 120/69  Pulse: (!) 101 (!) 102 (!) 102 (!) 108  Resp: 18 16 16 16   Temp:  98.5 F (36.9 C) (!) 97.4 F (36.3 C) 98.1 F (36.7 C)  TempSrc:  Oral Oral Oral  SpO2: 100% 98% 100% 99%  Weight:      Height:        Intake/Output Summary (Last 24 hours) at 08/03/2017 1452 Last data filed at 08/03/2017 1415 Gross per 24 hour  Intake 600 ml  Output 650 ml  Net -50 ml   Filed Weights   07/31/17 1914  Weight: 51.3 kg (113 lb)    Examination: General exam: Appears comfortable  HEENT: PERRLA, oral mucosa moist, no sclera icterus or thrush Respiratory system: Clear to auscultation. Respiratory effort normal. Cardiovascular system: S1 & S2 heard, RRR.   Gastrointestinal system: Abdomen soft, non-tender, nondistended. Normal bowel sound. No organomegaly Central nervous system: Alert and oriented. No focal neurological deficits. Extremities: No cyanosis, clubbing or edema Skin: numerous bruises and scabs on extremities.  Psychiatry:  Mood & affect appropriate.     Data Reviewed: I have personally reviewed following labs and imaging studies  CBC: Recent Labs  Lab 07/31/17 1945 08/01/17 0830  WBC 11.0* 8.6  HGB 9.6* 8.7*  HCT 31.2* 28.5*  MCV 85.5 86.1  PLT 316 262   Basic Metabolic Panel: Recent Labs  Lab 07/31/17 1945 08/01/17 0830 08/02/17 0615  NA 142 143 138  K 3.4* 3.5 3.7  CL 111 114* 112*  CO2 22 22 22   GLUCOSE 111* 94 97  BUN 30* 23* 15  CREATININE 0.98 0.81 0.78  CALCIUM 8.9 8.3* 8.1*  MG 2.0  --   --    GFR: Estimated Creatinine Clearance: 48.5 mL/min (by C-G formula based on SCr of 0.78 mg/dL). Liver Function Tests: No results for input(s): AST, ALT,  ALKPHOS, BILITOT, PROT, ALBUMIN in the last 168 hours. No results for input(s): LIPASE, AMYLASE in the last 168 hours. No results for input(s): AMMONIA in the last 168 hours. Coagulation Profile: No results for input(s): INR, PROTIME in the last 168 hours. Cardiac Enzymes: Recent Labs  Lab 07/31/17 2001 08/01/17 0009  CKTOTAL 430*  --   TROPONINI  --  <0.03   BNP (last 3 results) No results for input(s): PROBNP in the last 8760 hours. HbA1C: No results for input(s): HGBA1C in the last 72 hours. CBG: Recent Labs  Lab 07/31/17 1957  GLUCAP 102*  Lipid Profile: No results for input(s): CHOL, HDL, LDLCALC, TRIG, CHOLHDL, LDLDIRECT in the last 72 hours. Thyroid Function Tests: No results for input(s): TSH, T4TOTAL, FREET4, T3FREE, THYROIDAB in the last 72 hours. Anemia Panel: Recent Labs    08/01/17 0830  FERRITIN 45  TIBC 282  IRON 16*   Urine analysis:    Component Value Date/Time   COLORURINE YELLOW 07/31/2017 2201   APPEARANCEUR HAZY (A) 07/31/2017 2201   LABSPEC 1.017 07/31/2017 2201   PHURINE 8.0 07/31/2017 2201   GLUCOSEU NEGATIVE 07/31/2017 2201   HGBUR SMALL (A) 07/31/2017 2201   BILIRUBINUR NEGATIVE 07/31/2017 2201   KETONESUR NEGATIVE 07/31/2017 2201   PROTEINUR 30 (A) 07/31/2017 2201   NITRITE POSITIVE (A) 07/31/2017 2201   LEUKOCYTESUR SMALL (A) 07/31/2017 2201   Sepsis Labs: @LABRCNTIP (procalcitonin:4,lacticidven:4) ) Recent Results (from the past 240 hour(s))  Urine culture     Status: Abnormal   Collection Time: 07/31/17 10:01 PM  Result Value Ref Range Status   Specimen Description   Final    URINE, RANDOM Performed at [facility], [address], [place], [place] [address]    Special Requests   Final    NONE Performed at [facility], [address] [place], [place] [address]    Culture (A)  Final    >=100,000 COLONIES/mL DIPHTHEROIDS(CORYNEBACTERIUM SPECIES)   Report Status 08/02/2017 FINAL   Final         Radiology Studies: No results found.    Scheduled Meds: . aspirin  81 mg Oral QODAY  . atorvastatin  40 mg Oral Daily  . enoxaparin (LOVENOX) injection  40 mg Subcutaneous Q24H  . escitalopram  20 mg Oral Daily   Continuous Infusions:    LOS: 2 days    Time spent in minutes: 35    Calvert Cantor, MD [facility] Pager: [email or web address] Password [email or web address] 08/03/2017, 2:52 PM [facility]

## 2017-08-03 NOTE — Progress Notes (Signed)
Patient very irritable and refused to wear continuous pulse ox or to allow sacral dressing to be examined.

## 2017-08-04 NOTE — Progress Notes (Signed)
PROGRESS NOTE    Karen Larson   UJW:[id number]  DOB: Nov 26, 1940  DOA: 07/31/2017 PCP: System, Pcp Not In   Brief Narrative:  Karen Larson 77 y/o female iwho is s/p TAVR, PPM, COPD, hard of hearing who lives alone is sent to the hospital by her sister Karen Larson (who came to check on her) because she fell and could not get up. She states she fell onto the couch and slept on it. A few days before, the same sister found her on the floor after a fall. She tells me she thought she was at her sister's house at that time and was calling out for her but realized later she was at her house. She states she has not been eating or drinking well because she has not been able to get to the refrigerator. She has numerous bruises and scabs on her body. She is not confused.  She admits to wearing depends but is not incontinent. She wears them in case she urinates on herself before she can get up but it has never happened. She notes that the last time she urinated, it burned. She takes her medicines daily but recalls there was one day she missed them.  Her sister states that she lives alone and they have taken her car away. She does not go out at all. She does not cook as she does not have a stove but she has a microwave. Her 3 sisters Karen Larson and Karen Larson are involved in her care. Sisters fill pill box for her.    Subjective: No new complaints.   Assessment & Plan:   Principal Problem: Lethargy- frequent falls Dehydration- hypotension- BP 102/70 on admission - apparently has been sleeping a lot in addition to falling - not on diuretics - Losartan is on hold  - BP 106/61 has improved with hydration and holding Losartan to 133/85 - PT eval >>> SNF recommended.   Active Problems:   Low grade temp 99.1 today - follow - no new symptoms   Aortic stenosis- chronic systolic CHF - s/p TAVR in 10/18 - last ECHO in 6/18 > Mildly dilated LV with EF 45-50%. Wall motion abnormalities as   noted above.  Normal RV size and systolic function. Mild pulmonary   hypertension. Severe aortic stenosis. - ECHO below is the same as prior - cardiologist at [facility]  COPD - ex smoker- not on inhalers- not wheezing   Dementia - sister feels she may have dementia  Depression - takes Lexapro  PPM   DVT prophylaxis: lovenox Code Status: full code Family Communication: sisters, Karen Larson and Karen Larson  Disposition Plan:  - family prefers [facility] [place] - will d/c Monday  Consultants:   none Procedures:   2 D ECHO Left ventricle: The cavity size was normal. Systolic function was   mildly reduced. The estimated ejection fraction was in the range   of 45% to 50%. Hypokinesis of the basal anteroseptal and anterior   myocardium. Doppler parameters are consistent with abnormal left   ventricular relaxation (grade 1 diastolic dysfunction). - Aortic valve: Transvalvular velocity was within the normal range.   There was no stenosis. There was no regurgitation. Valve area   (VTI): 1.96 cm^2. Valve area (Vmax): 1.84 cm^2. Valve area   (Vmean): 1.72 cm^2. - Mitral valve: Transvalvular velocity was within the normal range.   There was no evidence for stenosis. There was trivial   regurgitation. - Right ventricle: The cavity size was normal. Wall thickness was  normal. Systolic function was normal. - Atrial septum: No defect or patent foramen ovale was identified. - Tricuspid valve: There was mild-moderate regurgitation. - Pulmonary arteries: Systolic pressure was within the normal   range. PA peak pressure: 37 mm Hg (S). Antimicrobials:  Anti-infectives (From admission, onward)   Start     Dose/Rate Route Frequency Ordered Stop   08/01/17 2200  cefTRIAXone (ROCEPHIN) 1 g in sodium chloride 0.9 % 100 mL IVPB  Status:  Discontinued     1 g 200 mL/hr over 30 Minutes Intravenous Every 24 hours 08/01/17 0003 08/01/17 1302   07/31/17 2300  cefTRIAXone (ROCEPHIN) 1 g in sodium chloride  0.9 % 100 mL IVPB     1 g 200 mL/hr over 30 Minutes Intravenous  Once 07/31/17 2245 07/31/17 2348       Objective: Vitals:   08/03/17 0606 08/03/17 1409 08/03/17 2208 08/04/17 0640  BP: 121/62 120/69 (!) 143/83 (!) 133/95  Pulse: (!) 102 (!) 108 (!) 118 (!) 103  Resp: 16 16 14 16   Temp: (!) 97.4 F (36.3 C) 98.1 F (36.7 C) 98.2 F (36.8 C) 99.1 F (37.3 C)  TempSrc: Oral Oral Oral Oral  SpO2: 100% 99% 99% 96%  Weight:      Height:        Intake/Output Summary (Last 24 hours) at 08/04/2017 1217 Last data filed at 08/04/2017 0800 Gross per 24 hour  Intake 600 ml  Output 600 ml  Net 0 ml   Filed Weights   07/31/17 1914  Weight: 51.3 kg (113 lb)    Examination: General exam: Appears comfortable  HEENT: PERRLA, oral mucosa moist, no sclera icterus or thrush Respiratory system: Clear to auscultation. Respiratory effort normal. Cardiovascular system: S1 & S2 heard, RRR.   Gastrointestinal system: Abdomen soft, non-tender, nondistended. Normal bowel sound. No organomegaly Central nervous system: Alert and oriented. No focal neurological deficits. Extremities: No cyanosis, clubbing or edema Skin: numerous bruises and scabs on extremities.  Psychiatry:  Mood & affect appropriate.     Data Reviewed: I have personally reviewed following labs and imaging studies  CBC: Recent Labs  Lab 07/31/17 1945 08/01/17 0830  WBC 11.0* 8.6  HGB 9.6* 8.7*  HCT 31.2* 28.5*  MCV 85.5 86.1  PLT 316 262   Basic Metabolic Panel: Recent Labs  Lab 07/31/17 1945 08/01/17 0830 08/02/17 0615  NA 142 143 138  K 3.4* 3.5 3.7  CL 111 114* 112*  CO2 22 22 22   GLUCOSE 111* 94 97  BUN 30* 23* 15  CREATININE 0.98 0.81 0.78  CALCIUM 8.9 8.3* 8.1*  MG 2.0  --   --    GFR: Estimated Creatinine Clearance: 48.5 mL/min (by C-G formula based on SCr of 0.78 mg/dL). Liver Function Tests: No results for input(s): AST, ALT, ALKPHOS, BILITOT, PROT, ALBUMIN in the last 168 hours. No results for  input(s): LIPASE, AMYLASE in the last 168 hours. No results for input(s): AMMONIA in the last 168 hours. Coagulation Profile: No results for input(s): INR, PROTIME in the last 168 hours. Cardiac Enzymes: Recent Labs  Lab 07/31/17 2001 08/01/17 0009  CKTOTAL 430*  --   TROPONINI  --  <0.03   BNP (last 3 results) No results for input(s): PROBNP in the last 8760 hours. HbA1C: No results for input(s): HGBA1C in the last 72 hours. CBG: Recent Labs  Lab 07/31/17 1957  GLUCAP 102*   Lipid Profile: No results for input(s): CHOL, HDL, LDLCALC, TRIG, CHOLHDL, LDLDIRECT in the last  72 hours. Thyroid Function Tests: No results for input(s): TSH, T4TOTAL, FREET4, T3FREE, THYROIDAB in the last 72 hours. Anemia Panel: No results for input(s): VITAMINB12, FOLATE, FERRITIN, TIBC, IRON, RETICCTPCT in the last 72 hours. Urine analysis:    Component Value Date/Time   COLORURINE YELLOW 07/31/2017 2201   APPEARANCEUR HAZY (A) 07/31/2017 2201   LABSPEC 1.017 07/31/2017 2201   PHURINE 8.0 07/31/2017 2201   GLUCOSEU NEGATIVE 07/31/2017 2201   HGBUR SMALL (A) 07/31/2017 2201   BILIRUBINUR NEGATIVE 07/31/2017 2201   KETONESUR NEGATIVE 07/31/2017 2201   PROTEINUR 30 (A) 07/31/2017 2201   NITRITE POSITIVE (A) 07/31/2017 2201   LEUKOCYTESUR SMALL (A) 07/31/2017 2201   Sepsis Labs: @LABRCNTIP (procalcitonin:4,lacticidven:4) ) Recent Results (from the past 240 hour(s))  Urine culture     Status: Abnormal   Collection Time: 07/31/17 10:01 PM  Result Value Ref Range Status   Specimen Description   Final    URINE, RANDOM Performed at [facility], [address] [place], [place] [address]    Special Requests   Final    NONE Performed at [facility], [address] [place], [place] [address]    Culture (A)  Final    >=100,000 COLONIES/mL DIPHTHEROIDS(CORYNEBACTERIUM SPECIES)   Report Status 08/02/2017 FINAL  Final         Radiology Studies: No  results found.    Scheduled Meds: . aspirin  81 mg Oral QODAY  . atorvastatin  40 mg Oral Daily  . enoxaparin (LOVENOX) injection  40 mg Subcutaneous Q24H  . escitalopram  20 mg Oral Daily   Continuous Infusions:    LOS: 3 days    Time spent in minutes: 35    Calvert Cantor, MD [facility] Pager: [email or web address] Password [email or web address] 08/04/2017, 12:17 PM [facility]

## 2017-08-05 ENCOUNTER — Inpatient Hospital Stay (HOSPITAL_COMMUNITY): Payer: Medicare Other

## 2017-08-05 DIAGNOSIS — A419 Sepsis, unspecified organism: Secondary | ICD-10-CM

## 2017-08-05 DIAGNOSIS — J440 Chronic obstructive pulmonary disease with acute lower respiratory infection: Secondary | ICD-10-CM

## 2017-08-05 DIAGNOSIS — J209 Acute bronchitis, unspecified: Secondary | ICD-10-CM

## 2017-08-05 LAB — URINALYSIS, ROUTINE W REFLEX MICROSCOPIC
BILIRUBIN URINE: NEGATIVE
Bacteria, UA: NONE SEEN
Glucose, UA: NEGATIVE mg/dL
KETONES UR: NEGATIVE mg/dL
Leukocytes, UA: NEGATIVE
NITRITE: NEGATIVE
PROTEIN: NEGATIVE mg/dL
Specific Gravity, Urine: 1.006 (ref 1.005–1.030)
pH: 7 (ref 5.0–8.0)

## 2017-08-05 LAB — BASIC METABOLIC PANEL
Anion gap: 9 (ref 5–15)
BUN: 14 mg/dL (ref 6–20)
CHLORIDE: 102 mmol/L (ref 101–111)
CO2: 22 mmol/L (ref 22–32)
Calcium: 8.2 mg/dL — ABNORMAL LOW (ref 8.9–10.3)
Creatinine, Ser: 0.8 mg/dL (ref 0.44–1.00)
GFR calc non Af Amer: >60 mL/min (ref >60)
Glucose, Bld: 162 mg/dL — ABNORMAL HIGH (ref 65–99)
POTASSIUM: 3.5 mmol/L (ref 3.5–5.1)
Sodium: 133 mmol/L — ABNORMAL LOW (ref 135–145)

## 2017-08-05 LAB — CBC
HEMATOCRIT: 29.2 % — AB (ref 36.0–46.0)
HEMOGLOBIN: 9.1 g/dL — AB (ref 12.0–15.0)
MCH: 25.9 pg — AB (ref 26.0–34.0)
MCHC: 31.2 g/dL (ref 30.0–36.0)
MCV: 83.2 fL (ref 78.0–100.0)
Platelets: 385 10*3/uL (ref 150–400)
RBC: 3.51 MIL/uL — AB (ref 3.87–5.11)
RDW: 15.9 % — ABNORMAL HIGH (ref 11.5–15.5)
WBC: 11.2 10*3/uL — ABNORMAL HIGH (ref 4.0–10.5)

## 2017-08-05 MED ORDER — ALBUTEROL SULFATE (2.5 MG/3ML) 0.083% IN NEBU
2.5000 mg | INHALATION_SOLUTION | RESPIRATORY_TRACT | Status: DC | PRN
  Administered 2017-08-05: 2.5 mg via RESPIRATORY_TRACT
  Filled 2017-08-05: qty 3

## 2017-08-05 MED ORDER — AZITHROMYCIN 250 MG PO TABS
500.0000 mg | ORAL_TABLET | Freq: Every day | ORAL | Status: AC
  Administered 2017-08-05: 500 mg via ORAL
  Filled 2017-08-05: qty 2

## 2017-08-05 MED ORDER — ALBUTEROL SULFATE (2.5 MG/3ML) 0.083% IN NEBU
2.5000 mg | INHALATION_SOLUTION | Freq: Two times a day (BID) | RESPIRATORY_TRACT | Status: DC
  Administered 2017-08-06: 2.5 mg via RESPIRATORY_TRACT
  Filled 2017-08-05: qty 3

## 2017-08-05 MED ORDER — AZITHROMYCIN 250 MG PO TABS
250.0000 mg | ORAL_TABLET | Freq: Every day | ORAL | Status: DC
  Administered 2017-08-06: 250 mg via ORAL
  Filled 2017-08-05: qty 1

## 2017-08-05 MED ORDER — ALBUTEROL SULFATE (2.5 MG/3ML) 0.083% IN NEBU
2.5000 mg | INHALATION_SOLUTION | Freq: Four times a day (QID) | RESPIRATORY_TRACT | Status: DC
  Administered 2017-08-05: 2.5 mg via RESPIRATORY_TRACT
  Filled 2017-08-05: qty 3

## 2017-08-05 NOTE — Progress Notes (Signed)
Physical Therapy Treatment Patient Details Name: Karen Larson MRN: [id number] DOB: 1940/06/06 Today's Date: 08/05/2017    History of Present Illness Pt is a 77 y.o. female with medical history significant for congestive heart failure, COPD, aortic stenosis, and pacemaker and admitted with lethargy, frequent falls, dehydration, hypotension    PT Comments    Patient requiring moderate assistance for bed mobility, transfers and short distance ambulation with RW. Ambulation limited by bout of incontinence of urine at bedside. Patient c/o pain with movement and therapeutic exercises. PT performed AAROM for 1st rep of each exercise and then patient completed each rep with cues only. Pt admitted with above diagnosis. Pt currently with functional limitations due to the deficits listed below (see PT Problem List). Pt will benefit from skilled PT to increase their independence and safety with mobility to allow discharge to the venue listed below.      Follow Up Recommendations  SNF     Equipment Recommendations       Recommendations for Other Services       Precautions / Restrictions Precautions Precautions: Fall Restrictions Weight Bearing Restrictions: No    Mobility  Bed Mobility Overal bed mobility: Needs Assistance Bed Mobility: Supine to Sit     Supine to sit: Mod assist;HOB elevated     General bed mobility comments: assist for bringing LEs over EOB  Transfers Overall transfer level: Needs assistance Equipment used: Rolling walker (2 wheeled) Transfers: Sit to/from [employer] Sit to Stand: Mod assist Stand pivot transfers: Mod assist       General transfer comment: mod assist for stand and steady, cues for hand placement  Ambulation/Gait Ambulation/Gait assistance: Mod assist Ambulation Distance (Feet): 5 Feet Assistive device: Rolling walker (2 wheeled) Gait Pattern/deviations: Decreased stride length;Shuffle;Step-to pattern Gait velocity:  decreased   General Gait Details: verbal cues for safe use of RW, assist for steadying. Patient incontinent at EOB so short amb to reclincer for stand pivot transfer   Stairs             Wheelchair Mobility    Modified Rankin (Stroke Patients Only)       Balance Overall balance assessment: History of Falls;Needs assistance   Sitting balance-Leahy Scale: Fair     Standing balance support: Bilateral upper extremity supported;During functional activity Standing balance-Leahy Scale: Poor                              Cognition Arousal/Alertness: Awake/alert Behavior During Therapy: WFL for tasks assessed/performed Overall Cognitive Status: Within Functional Limits for tasks assessed                                        Exercises General Exercises - Upper Extremity Shoulder Flexion: AROM;Strengthening;Both;5 reps;Seated General Exercises - Lower Extremity Long Arc Quad: AROM;Strengthening;Both;5 reps;Seated Hip Flexion/Marching: AROM;Strengthening;Both;5 reps;Seated Toe Raises: AROM;Strengthening;Both;5 reps;Seated Heel Raises: AROM;Strengthening;Both;5 reps;Seated    General Comments        Pertinent Vitals/Pain      Home Living                      Prior Function            PT Goals (current goals can now be found in the care plan section) Progress towards PT goals: Progressing toward goals    Frequency  Min 3X/week      PT Plan Current plan remains appropriate    Co-evaluation              AM-PAC PT "6 Clicks" Daily Activity  Outcome Measure  Difficulty turning over in bed (including adjusting bedclothes, sheets and blankets)?: A Lot Difficulty moving from lying on back to sitting on the side of the bed? : A Lot Difficulty sitting down on and standing up from a chair with arms (e.g., wheelchair, bedside commode, etc,.)?: A Lot Help needed moving to and from a bed to chair (including a  wheelchair)?: A Lot Help needed walking in hospital room?: A Lot Help needed climbing 3-5 steps with a railing? : Total 6 Click Score: 11    End of Session Equipment Utilized During Treatment: Gait belt Activity Tolerance: Patient tolerated treatment well;Patient limited by pain Patient left: in chair;with family/visitor present Nurse Communication: Mobility status PT Visit Diagnosis: Difficulty in walking, not elsewhere classified (R26.2);Repeated falls (R29.6)     Time: 1050-1120 PT Time Calculation (min) (ACUTE ONLY): 30 min  Charges:  $Gait Training: 8-22 mins $Therapeutic Exercise: 8-22 mins                    G Codes:      Loyda Costin D. Hartnett-Rands, MS, PT [facility] [place] [phone] 08/05/2017, 11:34 AM

## 2017-08-05 NOTE — Progress Notes (Addendum)
PROGRESS NOTE    Karen Larson   ZOX:[id number]  DOB: October 14, 1940  DOA: 07/31/2017 PCP: System, Pcp Not In   Brief Narrative:  Karen Larson 77 y/o female iwho is s/p TAVR, PPM, COPD, hard of hearing who lives alone is sent to the hospital by her sister [name] (who came to check on her) because she fell and could not get up. She states she fell onto the couch and slept on it. A few days before, the same sister found her on the floor after a fall. She tells me she thought she was at her sister's house at that time and was calling out for her but realized later she was at her house. She states she has not been eating or drinking well because she has not been able to get to the refrigerator. She has numerous bruises and scabs on her body. She is not confused.  She admits to wearing depends but is not incontinent. She wears them in case she urinates on herself before she can get up but it has never happened. She notes that the last time she urinated, it burned. She takes her medicines daily but recalls there was one day she missed them.  Her sister states that she lives alone and they have taken her car away. She does not go out at all. She does not cook as she does not have a stove but she has a microwave. Her 3 sisters [name] and [name] are involved in her care. Sisters fill pill box for her.    Subjective: No new complaints. Fever 101 documented in Epic last night. She has a cough but this is chronic. Sister states she often gets acute bronchitis. She has no other complaints.   Assessment & Plan:   Principal Problem: Lethargy- frequent falls Dehydration- hypotension- BP 102/70 on admission - apparently has been sleeping a lot in addition to falling - not on diuretics - Losartan is on hold  - BP 106/61 has improved with hydration and holding Losartan to 133/85 - PT eval >>> SNF recommended.   Active Problems:   Fevers- temps 99 and then 101 on 6/2 Sepsis- leukocytosis,  fever acute bronchitis - CXR negative, UA show diptheroids- no dysuria  - was given Rocephin on 5/30 - she is congested and states she has a dry cough- seems she may have acute bronchttis- start Z pak  COPD- acute bronchitis - see above - ex smoker- not on inhalers- currently as congestion- give Nebs QID in addition to Z pak   Aortic stenosis- chronic systolic CHF - s/p TAVR in 10/18 - last ECHO in 6/18 > Mildly dilated LV with EF 45-50%. Wall motion abnormalities as   noted above. Normal RV size and systolic function. Mild pulmonary   hypertension. Severe aortic stenosis. - ECHO below is the same as prior - cardiologist at [facility]   Dementia - sister feels she may have dementia  Depression - takes Lexapro  PPM   DVT prophylaxis: lovenox Code Status: full code Family Communication: sisters, [name] and [name]  Disposition Plan:  - family prefers [facility] [place] - will d/c Monday  Consultants:   none Procedures:   2 D ECHO Left ventricle: The cavity size was normal. Systolic function was   mildly reduced. The estimated ejection fraction was in the range   of 45% to 50%. Hypokinesis of the basal anteroseptal and anterior   myocardium. Doppler parameters are consistent with abnormal left   ventricular relaxation (  grade 1 diastolic dysfunction). - Aortic valve: Transvalvular velocity was within the normal range.   There was no stenosis. There was no regurgitation. Valve area   (VTI): 1.96 cm^2. Valve area (Vmax): 1.84 cm^2. Valve area   (Vmean): 1.72 cm^2. - Mitral valve: Transvalvular velocity was within the normal range.   There was no evidence for stenosis. There was trivial   regurgitation. - Right ventricle: The cavity size was normal. Wall thickness was   normal. Systolic function was normal. - Atrial septum: No defect or patent foramen ovale was identified. - Tricuspid valve: There was mild-moderate regurgitation. - Pulmonary arteries: Systolic  pressure was within the normal   range. PA peak pressure: 37 mm Hg (S). Antimicrobials:  Anti-infectives (From admission, onward)   Start     Dose/Rate Route Frequency Ordered Stop   08/01/17 2200  cefTRIAXone (ROCEPHIN) 1 g in sodium chloride 0.9 % 100 mL IVPB  Status:  Discontinued     1 g 200 mL/hr over 30 Minutes Intravenous Every 24 hours 08/01/17 0003 08/01/17 1302   07/31/17 2300  cefTRIAXone (ROCEPHIN) 1 g in sodium chloride 0.9 % 100 mL IVPB     1 g 200 mL/hr over 30 Minutes Intravenous  Once 07/31/17 2245 07/31/17 2348       Objective: Vitals:   08/04/17 0640 08/04/17 1341 08/04/17 2136 08/05/17 0501  BP: (!) 133/95 135/72 124/66 114/61  Pulse: (!) 103 (!) 111 (!) 120 (!) 105  Resp: 16  16 14   Temp: 99.1 F (37.3 C)  (!) 101.1 F (38.4 C) 97.6 F (36.4 C)  TempSrc: Oral  Axillary Oral  SpO2: 96% 100% 99% 96%  Weight:      Height:        Intake/Output Summary (Last 24 hours) at 08/05/2017 1318 Last data filed at 08/05/2017 1100 Gross per 24 hour  Intake 600 ml  Output 1000 ml  Net -400 ml   Filed Weights   07/31/17 1914  Weight: 51.3 kg (113 lb)    Examination: General exam: Appears comfortable  HEENT: PERRLA, oral mucosa moist, no sclera icterus or thrush Respiratory system: mild rhonchi bilaterally  Cardiovascular system: S1 & S2 heard, RRR.   Gastrointestinal system: Abdomen soft, non-tender, nondistended. Normal bowel sound. No organomegaly Central nervous system: Alert and oriented. No focal neurological deficits. Extremities: No cyanosis, clubbing or edema Skin: numerous bruises and scabs on extremities.  Psychiatry:  Mood & affect appropriate.     Data Reviewed: I have personally reviewed following labs and imaging studies  CBC: Recent Labs  Lab 07/31/17 1945 08/01/17 0830 08/05/17 0827  WBC 11.0* 8.6 11.2*  HGB 9.6* 8.7* 9.1*  HCT 31.2* 28.5* 29.2*  MCV 85.5 86.1 83.2  PLT 316 262 385   Basic Metabolic Panel: Recent Labs  Lab  07/31/17 1945 08/01/17 0830 08/02/17 0615 08/05/17 0827  NA 142 143 138 133*  K 3.4* 3.5 3.7 3.5  CL 111 114* 112* 102  CO2 22 22 22 22   GLUCOSE 111* 94 97 162*  BUN 30* 23* 15 14  CREATININE 0.98 0.81 0.78 0.80  CALCIUM 8.9 8.3* 8.1* 8.2*  MG 2.0  --   --   --    GFR: Estimated Creatinine Clearance: 48.5 mL/min (by C-G formula based on SCr of 0.8 mg/dL). Liver Function Tests: No results for input(s): AST, ALT, ALKPHOS, BILITOT, PROT, ALBUMIN in the last 168 hours. No results for input(s): LIPASE, AMYLASE in the last 168 hours. No results for input(s): AMMONIA  in the last 168 hours. Coagulation Profile: No results for input(s): INR, PROTIME in the last 168 hours. Cardiac Enzymes: Recent Labs  Lab 07/31/17 2001 08/01/17 0009  CKTOTAL 430*  --   TROPONINI  --  <0.03   BNP (last 3 results) No results for input(s): PROBNP in the last 8760 hours. HbA1C: No results for input(s): HGBA1C in the last 72 hours. CBG: Recent Labs  Lab 07/31/17 1957  GLUCAP 102*   Lipid Profile: No results for input(s): CHOL, HDL, LDLCALC, TRIG, CHOLHDL, LDLDIRECT in the last 72 hours. Thyroid Function Tests: No results for input(s): TSH, T4TOTAL, FREET4, T3FREE, THYROIDAB in the last 72 hours. Anemia Panel: No results for input(s): VITAMINB12, FOLATE, FERRITIN, TIBC, IRON, RETICCTPCT in the last 72 hours. Urine analysis:    Component Value Date/Time   COLORURINE YELLOW 08/05/2017 1100   APPEARANCEUR CLEAR 08/05/2017 1100   LABSPEC 1.006 08/05/2017 1100   PHURINE 7.0 08/05/2017 1100   GLUCOSEU NEGATIVE 08/05/2017 1100   HGBUR SMALL (A) 08/05/2017 1100   BILIRUBINUR NEGATIVE 08/05/2017 1100   KETONESUR NEGATIVE 08/05/2017 1100   PROTEINUR NEGATIVE 08/05/2017 1100   NITRITE NEGATIVE 08/05/2017 1100   LEUKOCYTESUR NEGATIVE 08/05/2017 1100   Sepsis Labs: @LABRCNTIP (procalcitonin:4,lacticidven:4) ) Recent Results (from the past 240 hour(s))  Urine culture     Status: Abnormal    Collection Time: 07/31/17 10:01 PM  Result Value Ref Range Status   Specimen Description   Final    URINE, RANDOM Performed at [facility], [address] [place], [place] [address]    Special Requests   Final    NONE Performed at [facility], [address], [place], [place] [address]    Culture (A)  Final    >=100,000 COLONIES/mL DIPHTHEROIDS(CORYNEBACTERIUM SPECIES)   Report Status 08/02/2017 FINAL  Final         Radiology Studies: Dg Chest Port 1 View  Result Date: 08/05/2017 CLINICAL DATA:  Fever EXAM: PORTABLE CHEST 1 VIEW COMPARISON:  Jul 31, 2017 FINDINGS: There is no edema or consolidation. The heart size and pulmonary vascularity are normal. No adenopathy. There is aortic atherosclerosis. Pacemaker leads are attached the right atrium right ventricle. There is a prosthetic aortic valve. There is evidence of old trauma involving the right lateral clavicle with remodeling. IMPRESSION: No edema or consolidation. Stable cardiac silhouette. There is aortic atherosclerosis. Aortic Atherosclerosis (ICD10-I70.0). Electronically Signed   By: Bretta Bang III M.D.   On: 08/05/2017 08:15      Scheduled Meds: . aspirin  81 mg Oral QODAY  . atorvastatin  40 mg Oral Daily  . enoxaparin (LOVENOX) injection  40 mg Subcutaneous Q24H  . escitalopram  20 mg Oral Daily   Continuous Infusions:    LOS: 4 days    Time spent in minutes: 35    Calvert Cantor, MD [facility] Pager: [email or web address] Password [email or web address] 08/05/2017, 1:18 PM [facility]

## 2017-08-06 DIAGNOSIS — Z952 Presence of prosthetic heart valve: Secondary | ICD-10-CM

## 2017-08-06 DIAGNOSIS — J209 Acute bronchitis, unspecified: Secondary | ICD-10-CM

## 2017-08-06 DIAGNOSIS — A419 Sepsis, unspecified organism: Secondary | ICD-10-CM

## 2017-08-06 DIAGNOSIS — J440 Chronic obstructive pulmonary disease with acute lower respiratory infection: Secondary | ICD-10-CM

## 2017-08-06 MED ORDER — BUDESONIDE-FORMOTEROL FUMARATE 80-4.5 MCG/ACT IN AERO: 2.0000 | INHALATION_SPRAY | Freq: Two times a day (BID) | RESPIRATORY_TRACT | 12 refills | Status: DC

## 2017-08-06 MED ORDER — AZITHROMYCIN 250 MG PO TABS: 250.0000 mg | ORAL_TABLET | Freq: Every day | ORAL | 0 refills | Status: DC

## 2017-08-06 MED ORDER — ALBUTEROL SULFATE (2.5 MG/3ML) 0.083% IN NEBU: 2.5000 mg | INHALATION_SOLUTION | RESPIRATORY_TRACT | 12 refills | Status: DC | PRN

## 2017-08-06 NOTE — Discharge Summary (Signed)
Physician Discharge Summary  Karen Larson:[id number] DOB: 1940/12/30 DOA: 07/31/2017  PCP: System, Pcp Not In  Admit date: 07/31/2017 Discharge date: 08/06/2017  Admitted From: home Disposition:  snf   Recommendations for Outpatient Follow-up:  1. Cont 4 more days of Zithromax 2. F/u on dementia- may not be safe to return home alone  Discharge Condition:  stable CODE STATUS:  Full code   Diet recommendation:  Regular diet Consultations:  none    Discharge Diagnoses:  Principal Problem: Dehydration/ hypotension/ falls / generalized weakness Active Problems:   Acute bronchitis- sepsis Chronic obstructive pulmonary disease (HCC)   Aortic stenosis s/p TAVR   Chronic systolic CHF (congestive heart failure) (HCC)  Brief Summary: Karen Larson 77 y/o female who is s/p TAVR, PPM, COPD, hard of hearing who lives alone is sent to the hospital by her sister [name] (who came to check on her) because she fell and could not get up.  She states she fell onto the couch and slept on it. A few days before, the same sister found her on the floor after a fall. She tells me she thought she was at her sister's house at that time and was calling out for her but realized later she was at her house. She states she has not been eating or drinking well because she has not been able to get to the refrigerator. She has numerous bruises and scabs on her body. She is not confused.  She admits to wearing depends but is not incontinent. She wears them in case she urinates on herself before she can get up but it has never happened. She notes that the last time she urinated, it burned. She takes her medicines daily but recalls there was one day she missed them.  Her sister states that she lives alone and they have taken her car away. She does not go out at all. She does not cook as she does not have a stove but she has a microwave. Her 3 sisters [name] and [name] are involved in her care. Sisters fill  pill box for her.     Hospital Course:  Dehydration- hypotension- BP 102/70 on admission Frequent falls - apparently has been sleeping a lot in addition to falling - not on diuretics - Losartan is on hold - will cont to hold - BP 106/61 has improved with hydration but SBP still < 120 - urine output over past 24 hrs 1200 cc - PT eval >>> SNF recommended. She is still not able to get up out of bed on her own and will go to SNF - she is eating and drinking very well  Active Problems:   Fevers- temps 99 and then 101 on 6/2 Sepsis- leukocytosis, fever acute bronchitis - suspect she may have been having fever which may have been causing her falls - CXR negative, UA showed small leukocytes and + nitrites but no bacteria- culture show diptheroids-   - was given Rocephin on 5/30 for possibly UTI but this was not continued - 6/2 evening- fever 101 documented - 6/3- she is congested and states she has a dry cough- seems she may have acute bronchttis- started Z pak  - 6/4 - fevers have resolved, no longer wheezing- UA is negative  COPD- acute bronchitis - see above - ex smoker- not on inhalers- see above-- gived Nebs QID in addition to Z pak - the patient' sister states the patient always has a cough- have added Symbicort for maintenance  treatment   Aortic stenosis s/p TAVR- chronic systolic CHF - s/p TAVR in 10/18 - last ECHO in 6/18 > Mildly dilated LV with EF 45-50%. Wall motion abnormalities as noted above. Normal RV size and systolic function. Mild pulmonary hypertension.  - ECHO from 08/01/17 below is the same as prior- see report below - cardiologist at [facility]   Dementia - sister feels she may have dementia  Depression - takes Lexapro  PPM   Discharge Exam: Vitals:   08/06/17 0509 08/06/17 0725  BP: (!) 113/59   Pulse: 95   Resp: 15   Temp: 97.9 F (36.6 C)   SpO2: 96% 95%   Vitals:   08/05/17 2200 08/05/17 2301 08/06/17 0509 08/06/17 0725  BP:   (!)  113/59   Pulse:   95   Resp:   15   Temp: 98.5 F (36.9 C)  97.9 F (36.6 C)   TempSrc: Oral  Oral   SpO2:  97% 96% 95%  Weight:      Height:        General: Pt is alert, awake, not in acute distress Cardiovascular: RRR, S1/S2 +, no rubs, no gallops Respiratory: CTA bilaterally, no wheezing, no rhonchi Abdominal: Soft, NT, ND, bowel sounds + Extremities: no edema, no cyanosis   Discharge Instructions  Discharge Instructions    Diet general   Complete by:  As directed    Increase activity slowly   Complete by:  As directed      Allergies as of 08/06/2017      Reactions   Tiotropium Bromide Monohydrate Nausea Only   Doxycycline Nausea Only   Levaquin  [levofloxacin] Nausea Only      Medication List    STOP taking these medications   losartan 25 MG tablet Commonly known as:  COZAAR   Potassium Chloride ER 20 MEQ Tbcr     TAKE these medications   albuterol (2.5 MG/3ML) 0.083% nebulizer solution Commonly known as:  PROVENTIL Take 3 mLs (2.5 mg total) by nebulization every 2 (two) hours as needed for wheezing or shortness of breath.   aspirin 81 MG chewable tablet Chew 81 mg by mouth every other day.   atorvastatin 40 MG tablet Commonly known as:  LIPITOR Take 40 mg by mouth daily.   azithromycin 250 MG tablet Commonly known as:  ZITHROMAX Take 1 tablet (250 mg total) by mouth daily.   budesonide-formoterol 80-4.5 MCG/ACT inhaler Commonly known as:  SYMBICORT Inhale 2 puffs into the lungs 2 (two) times daily.   escitalopram 20 MG tablet Commonly known as:  LEXAPRO Take 20 mg by mouth daily.      Contact information for after-discharge care    Destination    [facility] .   Service:  Skilled Nursing Contact information: [address] [place] [address] [phone]             Allergies  Allergen Reactions  . Tiotropium Bromide Monohydrate Nausea Only  . Doxycycline Nausea Only  . Levaquin   [Levofloxacin] Nausea Only     Procedures/Studies: 2 D ECHO Study Conclusions  - Left ventricle: The cavity size was normal. Systolic function was   mildly reduced. The estimated ejection fraction was in the range   of 45% to 50%. Hypokinesis of the basal anteroseptal and anterior   myocardium. Doppler parameters are consistent with abnormal left   ventricular relaxation (grade 1 diastolic dysfunction). - Aortic valve: Transvalvular velocity was within the normal range.  There was no stenosis. There was no regurgitation. Valve area   (VTI): 1.96 cm^2. Valve area (Vmax): 1.84 cm^2. Valve area   (Vmean): 1.72 cm^2. - Mitral valve: Transvalvular velocity was within the normal range.   There was no evidence for stenosis. There was trivial   regurgitation. - Right ventricle: The cavity size was normal. Wall thickness was   normal. Systolic function was normal. - Atrial septum: No defect or patent foramen ovale was identified. - Tricuspid valve: There was mild-moderate regurgitation. - Pulmonary arteries: Systolic pressure was within the normal   range. PA peak pressure: 37 mm Hg (S).   Dg Chest 1 View  Result Date: 07/31/2017 CLINICAL DATA:  Fall. EXAM: CHEST  1 VIEW COMPARISON:  December 22, 2016 FINDINGS: Cardiomediastinal silhouette is stable. Stable pacemaker. No pneumothorax. No nodules or masses. No focal infiltrates. No pneumothorax. No visualized fractures. IMPRESSION: No active disease. Electronically Signed   By: Gerome Sam III M.D   On: 07/31/2017 21:21   Dg Pelvis 1-2 Views  Result Date: 07/31/2017 CLINICAL DATA:  Pain after fall EXAM: PELVIS - 1-2 VIEW COMPARISON:  None. FINDINGS: Dense calcified atherosclerosis in the aorta and iliac vessels. Degenerative changes in the lower lumbar spine. No acute fractures. IMPRESSION: Negative. Electronically Signed   By: Gerome Sam III M.D   On: 07/31/2017 21:22   Ct Head Wo Contrast  Result Date: 07/31/2017 CLINICAL  DATA:  Head trauma.  Status post fall. EXAM: CT HEAD WITHOUT CONTRAST CT CERVICAL SPINE WITHOUT CONTRAST TECHNIQUE: Multidetector CT imaging of the head and cervical spine was performed following the standard protocol without intravenous contrast. Multiplanar CT image reconstructions of the cervical spine were also generated. COMPARISON:  None. FINDINGS: CT HEAD FINDINGS Brain: No evidence of acute infarction, hemorrhage, extra-axial collection, ventriculomegaly, or mass effect. Generalized cerebral atrophy. Periventricular white matter low attenuation likely secondary to microangiopathy. Vascular: Cerebrovascular atherosclerotic calcifications are noted. Skull: Negative for fracture or focal lesion. Sinuses/Orbits: Visualized portions of the orbits are unremarkable. Visualized portions of the paranasal sinuses and mastoid air cells are unremarkable. Other: None. CT CERVICAL SPINE FINDINGS Alignment: 4 mm anterolisthesis of C4 on C5 secondary to facet disease. Skull base and vertebrae: No acute fracture. No primary bone lesion or focal pathologic process. Soft tissues and spinal canal: No prevertebral fluid or swelling. No visible canal hematoma. Disc levels: Degenerative disc disease with disc height loss at C5-6 and C6-7. Moderate right facet arthropathy at C2-3. Moderate left and mild right facet arthropathy at C3-4 with left foraminal stenosis. Broad-based disc osteophyte complex at C4-5. Bilateral uncovertebral degenerative changes at C5-6. Left uncovertebral degenerative changes at C6-7 with foraminal narrowing. Upper chest: Lung apices are clear. Other: No fluid collection or hematoma. IMPRESSION: 1. No acute intracranial pathology. 2.  No acute osseous injury of the cervical spine. Electronically Signed   By: Elige Ko   On: 07/31/2017 21:04   Ct Cervical Spine Wo Contrast  Result Date: 07/31/2017 CLINICAL DATA:  Head trauma.  Status post fall. EXAM: CT HEAD WITHOUT CONTRAST CT CERVICAL SPINE WITHOUT  CONTRAST TECHNIQUE: Multidetector CT imaging of the head and cervical spine was performed following the standard protocol without intravenous contrast. Multiplanar CT image reconstructions of the cervical spine were also generated. COMPARISON:  None. FINDINGS: CT HEAD FINDINGS Brain: No evidence of acute infarction, hemorrhage, extra-axial collection, ventriculomegaly, or mass effect. Generalized cerebral atrophy. Periventricular white matter low attenuation likely secondary to microangiopathy. Vascular: Cerebrovascular atherosclerotic calcifications are noted. Skull: Negative for fracture  or focal lesion. Sinuses/Orbits: Visualized portions of the orbits are unremarkable. Visualized portions of the paranasal sinuses and mastoid air cells are unremarkable. Other: None. CT CERVICAL SPINE FINDINGS Alignment: 4 mm anterolisthesis of C4 on C5 secondary to facet disease. Skull base and vertebrae: No acute fracture. No primary bone lesion or focal pathologic process. Soft tissues and spinal canal: No prevertebral fluid or swelling. No visible canal hematoma. Disc levels: Degenerative disc disease with disc height loss at C5-6 and C6-7. Moderate right facet arthropathy at C2-3. Moderate left and mild right facet arthropathy at C3-4 with left foraminal stenosis. Broad-based disc osteophyte complex at C4-5. Bilateral uncovertebral degenerative changes at C5-6. Left uncovertebral degenerative changes at C6-7 with foraminal narrowing. Upper chest: Lung apices are clear. Other: No fluid collection or hematoma. IMPRESSION: 1. No acute intracranial pathology. 2.  No acute osseous injury of the cervical spine. Electronically Signed   By: Elige Ko   On: 07/31/2017 21:04   Dg Chest Port 1 View  Result Date: 08/05/2017 CLINICAL DATA:  Fever EXAM: PORTABLE CHEST 1 VIEW COMPARISON:  Jul 31, 2017 FINDINGS: There is no edema or consolidation. The heart size and pulmonary vascularity are normal. No adenopathy. There is aortic  atherosclerosis. Pacemaker leads are attached the right atrium right ventricle. There is a prosthetic aortic valve. There is evidence of old trauma involving the right lateral clavicle with remodeling. IMPRESSION: No edema or consolidation. Stable cardiac silhouette. There is aortic atherosclerosis. Aortic Atherosclerosis (ICD10-I70.0). Electronically Signed   By: Bretta Bang III M.D.   On: 08/05/2017 08:15   Dg Foot Complete Left  Result Date: 07/31/2017 CLINICAL DATA:  Patient with pain to the anterior foot status post fall. Initial encounter. EXAM: LEFT FOOT - COMPLETE 3+ VIEW COMPARISON:  None. FINDINGS: Normal anatomic alignment. Midfoot degenerative changes. First MTP joint degenerative changes. No evidence for acute fracture or dislocation. IMPRESSION: No acute osseous abnormality.  Degenerative changes. Electronically Signed   By: Annia Belt M.D.   On: 07/31/2017 21:23      The results of significant diagnostics from this hospitalization (including imaging, microbiology, ancillary and laboratory) are listed below for reference.     Microbiology: Recent Results (from the past 240 hour(s))  Urine culture     Status: Abnormal   Collection Time: 07/31/17 10:01 PM  Result Value Ref Range Status   Specimen Description   Final    URINE, RANDOM Performed at [facility], [address], [place], [place] [address]    Special Requests   Final    NONE Performed at [facility], [address] [place], [place] [address]    Culture (A)  Final    >=100,000 COLONIES/mL DIPHTHEROIDS(CORYNEBACTERIUM SPECIES)   Report Status 08/02/2017 FINAL  Final     Labs: BNP (last 3 results) No results for input(s): BNP in the last 8760 hours. Basic Metabolic Panel: Recent Labs  Lab 07/31/17 1945 08/01/17 0830 08/02/17 0615 08/05/17 0827  NA 142 143 138 133*  K 3.4* 3.5 3.7 3.5  CL 111 114* 112* 102  CO2 22 22 22 22   GLUCOSE 111* 94 97 162*  BUN 30*  23* 15 14  CREATININE 0.98 0.81 0.78 0.80  CALCIUM 8.9 8.3* 8.1* 8.2*  MG 2.0  --   --   --    Liver Function Tests: No results for input(s): AST, ALT, ALKPHOS, BILITOT, PROT, ALBUMIN in the last 168 hours. No results for input(s): LIPASE, AMYLASE in the last 168  hours. No results for input(s): AMMONIA in the last 168 hours. CBC: Recent Labs  Lab 07/31/17 1945 08/01/17 0830 08/05/17 0827  WBC 11.0* 8.6 11.2*  HGB 9.6* 8.7* 9.1*  HCT 31.2* 28.5* 29.2*  MCV 85.5 86.1 83.2  PLT 316 262 385   Cardiac Enzymes: Recent Labs  Lab 07/31/17 2001 08/01/17 0009  CKTOTAL 430*  --   TROPONINI  --  <0.03   BNP: Invalid input(s): POCBNP CBG: Recent Labs  Lab 07/31/17 1957  GLUCAP 102*   D-Dimer No results for input(s): DDIMER in the last 72 hours. Hgb A1c No results for input(s): HGBA1C in the last 72 hours. Lipid Profile No results for input(s): CHOL, HDL, LDLCALC, TRIG, CHOLHDL, LDLDIRECT in the last 72 hours. Thyroid function studies No results for input(s): TSH, T4TOTAL, T3FREE, THYROIDAB in the last 72 hours.  Invalid input(s): FREET3 Anemia work up No results for input(s): VITAMINB12, FOLATE, FERRITIN, TIBC, IRON, RETICCTPCT in the last 72 hours. Urinalysis    Component Value Date/Time   COLORURINE YELLOW 08/05/2017 1100   APPEARANCEUR CLEAR 08/05/2017 1100   LABSPEC 1.006 08/05/2017 1100   PHURINE 7.0 08/05/2017 1100   GLUCOSEU NEGATIVE 08/05/2017 1100   HGBUR SMALL (A) 08/05/2017 1100   BILIRUBINUR NEGATIVE 08/05/2017 1100   KETONESUR NEGATIVE 08/05/2017 1100   PROTEINUR NEGATIVE 08/05/2017 1100   NITRITE NEGATIVE 08/05/2017 1100   LEUKOCYTESUR NEGATIVE 08/05/2017 1100   Sepsis Labs Invalid input(s): PROCALCITONIN,  WBC,  LACTICIDVEN Microbiology Recent Results (from the past 240 hour(s))  Urine culture     Status: Abnormal   Collection Time: 07/31/17 10:01 PM  Result Value Ref Range Status   Specimen Description   Final    URINE, RANDOM Performed at  [facility], [address] [place], [place] [address]    Special Requests   Final    NONE Performed at [facility], [address], [place], [place] [address]    Culture (A)  Final    >=100,000 COLONIES/mL DIPHTHEROIDS(CORYNEBACTERIUM SPECIES)   Report Status 08/02/2017 FINAL  Final     Time coordinating discharge in minutes: 65  SIGNED:   Calvert Cantor, MD  [facility] 08/06/2017, 10:45 AM Pager   If 7PM-7AM, please contact night-coverage [email or web address] Password [email or web address]

## 2017-08-06 NOTE — Progress Notes (Signed)
Report called to Ander Slade, Charity fundraiser at [facility]. [phone]

## 2017-08-06 NOTE — Progress Notes (Addendum)
Discharge to SNF: [facility] Room:30  Patient sister [name] plans to transport the patient to SNF.  Nurse given number to call report. [phone] No other SNF needs at this time.    3:00pm Patient and sister agreeable to [facility] transport for safety and inability to stand and pivot without assistance/fall risk. Med. Nes. Form complete.   Vivi Barrack, Theresia Majors, MSW Clinical Social Worker  [phone] 08/06/2017  12:37 PM

## 2018-02-07 ENCOUNTER — Inpatient Hospital Stay (HOSPITAL_COMMUNITY)
Admission: EM | Admit: 2018-02-07 | Discharge: 2018-03-05 | DRG: 871 | Disposition: E | Payer: Medicare Other | Attending: Internal Medicine | Admitting: Internal Medicine

## 2018-02-07 ENCOUNTER — Inpatient Hospital Stay (HOSPITAL_COMMUNITY): Payer: Medicare Other

## 2018-02-07 ENCOUNTER — Encounter (HOSPITAL_COMMUNITY): Payer: Self-pay

## 2018-02-07 ENCOUNTER — Emergency Department (HOSPITAL_COMMUNITY): Payer: Medicare Other

## 2018-02-07 ENCOUNTER — Other Ambulatory Visit: Payer: Self-pay

## 2018-02-07 DIAGNOSIS — F0390 Unspecified dementia without behavioral disturbance: Secondary | ICD-10-CM

## 2018-02-07 DIAGNOSIS — G9341 Metabolic encephalopathy: Secondary | ICD-10-CM | POA: Diagnosis present

## 2018-02-07 DIAGNOSIS — Z952 Presence of prosthetic heart valve: Secondary | ICD-10-CM | POA: Diagnosis not present

## 2018-02-07 DIAGNOSIS — E860 Dehydration: Secondary | ICD-10-CM | POA: Diagnosis present

## 2018-02-07 DIAGNOSIS — E870 Hyperosmolality and hypernatremia: Secondary | ICD-10-CM | POA: Diagnosis present

## 2018-02-07 DIAGNOSIS — J440 Chronic obstructive pulmonary disease with acute lower respiratory infection: Secondary | ICD-10-CM | POA: Diagnosis present

## 2018-02-07 DIAGNOSIS — J189 Pneumonia, unspecified organism: Secondary | ICD-10-CM | POA: Diagnosis present

## 2018-02-07 DIAGNOSIS — I959 Hypotension, unspecified: Secondary | ICD-10-CM | POA: Diagnosis not present

## 2018-02-07 DIAGNOSIS — R7989 Other specified abnormal findings of blood chemistry: Secondary | ICD-10-CM | POA: Diagnosis not present

## 2018-02-07 DIAGNOSIS — E039 Hypothyroidism, unspecified: Secondary | ICD-10-CM | POA: Diagnosis present

## 2018-02-07 DIAGNOSIS — F05 Delirium due to known physiological condition: Secondary | ICD-10-CM | POA: Diagnosis not present

## 2018-02-07 DIAGNOSIS — Z90710 Acquired absence of both cervix and uterus: Secondary | ICD-10-CM

## 2018-02-07 DIAGNOSIS — J181 Lobar pneumonia, unspecified organism: Secondary | ICD-10-CM | POA: Diagnosis not present

## 2018-02-07 DIAGNOSIS — Z79899 Other long term (current) drug therapy: Secondary | ICD-10-CM

## 2018-02-07 DIAGNOSIS — D72829 Elevated white blood cell count, unspecified: Secondary | ICD-10-CM | POA: Diagnosis present

## 2018-02-07 DIAGNOSIS — J449 Chronic obstructive pulmonary disease, unspecified: Secondary | ICD-10-CM | POA: Diagnosis present

## 2018-02-07 DIAGNOSIS — I5042 Chronic combined systolic (congestive) and diastolic (congestive) heart failure: Secondary | ICD-10-CM | POA: Diagnosis present

## 2018-02-07 DIAGNOSIS — I429 Cardiomyopathy, unspecified: Secondary | ICD-10-CM | POA: Diagnosis not present

## 2018-02-07 DIAGNOSIS — R0600 Dyspnea, unspecified: Secondary | ICD-10-CM | POA: Diagnosis not present

## 2018-02-07 DIAGNOSIS — Z66 Do not resuscitate: Secondary | ICD-10-CM | POA: Diagnosis present

## 2018-02-07 DIAGNOSIS — R6520 Severe sepsis without septic shock: Secondary | ICD-10-CM | POA: Diagnosis present

## 2018-02-07 DIAGNOSIS — Z7982 Long term (current) use of aspirin: Secondary | ICD-10-CM

## 2018-02-07 DIAGNOSIS — Y95 Nosocomial condition: Secondary | ICD-10-CM | POA: Diagnosis present

## 2018-02-07 DIAGNOSIS — I110 Hypertensive heart disease with heart failure: Secondary | ICD-10-CM | POA: Diagnosis present

## 2018-02-07 DIAGNOSIS — K117 Disturbances of salivary secretion: Secondary | ICD-10-CM | POA: Diagnosis not present

## 2018-02-07 DIAGNOSIS — Z515 Encounter for palliative care: Secondary | ICD-10-CM | POA: Diagnosis not present

## 2018-02-07 DIAGNOSIS — Z881 Allergy status to other antibiotic agents status: Secondary | ICD-10-CM

## 2018-02-07 DIAGNOSIS — Z681 Body mass index (BMI) 19 or less, adult: Secondary | ICD-10-CM | POA: Diagnosis not present

## 2018-02-07 DIAGNOSIS — F329 Major depressive disorder, single episode, unspecified: Secondary | ICD-10-CM | POA: Diagnosis present

## 2018-02-07 DIAGNOSIS — D509 Iron deficiency anemia, unspecified: Secondary | ICD-10-CM | POA: Diagnosis present

## 2018-02-07 DIAGNOSIS — Z972 Presence of dental prosthetic device (complete) (partial): Secondary | ICD-10-CM

## 2018-02-07 DIAGNOSIS — Z79890 Hormone replacement therapy (postmenopausal): Secondary | ICD-10-CM

## 2018-02-07 DIAGNOSIS — Z87891 Personal history of nicotine dependence: Secondary | ICD-10-CM

## 2018-02-07 DIAGNOSIS — I509 Heart failure, unspecified: Secondary | ICD-10-CM

## 2018-02-07 DIAGNOSIS — I482 Chronic atrial fibrillation, unspecified: Secondary | ICD-10-CM | POA: Diagnosis present

## 2018-02-07 DIAGNOSIS — A419 Sepsis, unspecified organism: Secondary | ICD-10-CM | POA: Diagnosis present

## 2018-02-07 DIAGNOSIS — Z9189 Other specified personal risk factors, not elsewhere classified: Secondary | ICD-10-CM

## 2018-02-07 DIAGNOSIS — Z7189 Other specified counseling: Secondary | ICD-10-CM | POA: Diagnosis not present

## 2018-02-07 DIAGNOSIS — E43 Unspecified severe protein-calorie malnutrition: Secondary | ICD-10-CM | POA: Diagnosis present

## 2018-02-07 DIAGNOSIS — I428 Other cardiomyopathies: Secondary | ICD-10-CM | POA: Diagnosis present

## 2018-02-07 DIAGNOSIS — Z7951 Long term (current) use of inhaled steroids: Secondary | ICD-10-CM

## 2018-02-07 DIAGNOSIS — Z950 Presence of cardiac pacemaker: Secondary | ICD-10-CM

## 2018-02-07 DIAGNOSIS — Z888 Allergy status to other drugs, medicaments and biological substances status: Secondary | ICD-10-CM

## 2018-02-07 HISTORY — DX: Unspecified atrial fibrillation: I48.91

## 2018-02-07 HISTORY — DX: Disorder of thyroid, unspecified: E07.9

## 2018-02-07 HISTORY — DX: Unspecified dementia, unspecified severity, without behavioral disturbance, psychotic disturbance, mood disturbance, and anxiety: F03.90

## 2018-02-07 HISTORY — DX: Nicotine dependence, unspecified, uncomplicated: F17.200

## 2018-02-07 HISTORY — DX: Major depressive disorder, single episode, unspecified: F32.9

## 2018-02-07 HISTORY — DX: Anemia, unspecified: D64.9

## 2018-02-07 HISTORY — DX: Hypothyroidism, unspecified: E03.9

## 2018-02-07 HISTORY — DX: Unspecified fall, initial encounter: W19.XXXA

## 2018-02-07 LAB — URINALYSIS, ROUTINE W REFLEX MICROSCOPIC
Bacteria, UA: NONE SEEN
Bilirubin Urine: NEGATIVE
GLUCOSE, UA: NEGATIVE mg/dL
KETONES UR: 5 mg/dL — AB
Leukocytes, UA: NEGATIVE
Nitrite: NEGATIVE
PH: 5 (ref 5.0–8.0)
Protein, ur: 30 mg/dL — AB
SPECIFIC GRAVITY, URINE: 1.024 (ref 1.005–1.030)

## 2018-02-07 LAB — COMPREHENSIVE METABOLIC PANEL
ALBUMIN: 2.4 g/dL — AB (ref 3.5–5.0)
ALK PHOS: 121 U/L (ref 38–126)
ALT: 44 U/L (ref 0–44)
ANION GAP: 10 (ref 5–15)
AST: 43 U/L — AB (ref 15–41)
BILIRUBIN TOTAL: 0.5 mg/dL (ref 0.3–1.2)
BUN: 26 mg/dL — ABNORMAL HIGH (ref 8–23)
CO2: 21 mmol/L — ABNORMAL LOW (ref 22–32)
Calcium: 8 mg/dL — ABNORMAL LOW (ref 8.9–10.3)
Chloride: 118 mmol/L — ABNORMAL HIGH (ref 98–111)
Creatinine, Ser: 1.01 mg/dL — ABNORMAL HIGH (ref 0.44–1.00)
GFR, EST NON AFRICAN AMERICAN: 54 mL/min — AB (ref >60)
Glucose, Bld: 102 mg/dL — ABNORMAL HIGH (ref 70–99)
POTASSIUM: 3.4 mmol/L — AB (ref 3.5–5.1)
Sodium: 149 mmol/L — ABNORMAL HIGH (ref 135–145)
TOTAL PROTEIN: 6.5 g/dL (ref 6.5–8.1)

## 2018-02-07 LAB — CBC WITH DIFFERENTIAL/PLATELET
Abs Immature Granulocytes: 0.42 10*3/uL — ABNORMAL HIGH (ref 0.00–0.07)
BASOS PCT: 0 %
Basophils Absolute: 0 10*3/uL (ref 0.0–0.1)
Eosinophils Absolute: 0 10*3/uL (ref 0.0–0.5)
Eosinophils Relative: 0 %
HCT: 34.8 % — ABNORMAL LOW (ref 36.0–46.0)
HEMOGLOBIN: 10.2 g/dL — AB (ref 12.0–15.0)
Immature Granulocytes: 2 %
Lymphocytes Relative: 4 %
Lymphs Abs: 1.1 10*3/uL (ref 0.7–4.0)
MCH: 23.7 pg — AB (ref 26.0–34.0)
MCHC: 29.3 g/dL — AB (ref 30.0–36.0)
MCV: 80.9 fL (ref 80.0–100.0)
Monocytes Absolute: 1.3 10*3/uL — ABNORMAL HIGH (ref 0.1–1.0)
Monocytes Relative: 5 %
NEUTROS PCT: 89 %
Neutro Abs: 22.4 10*3/uL — ABNORMAL HIGH (ref 1.7–7.7)
PLATELETS: 554 10*3/uL — AB (ref 150–400)
RBC: 4.3 MIL/uL (ref 3.87–5.11)
RDW: 19.3 % — ABNORMAL HIGH (ref 11.5–15.5)
WBC: 25.2 10*3/uL — AB (ref 4.0–10.5)
nRBC: 0.4 % — ABNORMAL HIGH (ref 0.0–0.2)

## 2018-02-07 LAB — I-STAT CHEM 8, ED
BUN: 29 mg/dL — ABNORMAL HIGH (ref 8–23)
Calcium, Ion: 1.09 mmol/L — ABNORMAL LOW (ref 1.15–1.40)
Chloride: 116 mmol/L — ABNORMAL HIGH (ref 98–111)
Creatinine, Ser: 1 mg/dL (ref 0.44–1.00)
Glucose, Bld: 98 mg/dL (ref 70–99)
HCT: 32 % — ABNORMAL LOW (ref 36.0–46.0)
Hemoglobin: 10.9 g/dL — ABNORMAL LOW (ref 12.0–15.0)
Potassium: 3.6 mmol/L (ref 3.5–5.1)
Sodium: 151 mmol/L — ABNORMAL HIGH (ref 135–145)
TCO2: 24 mmol/L (ref 22–32)

## 2018-02-07 LAB — I-STAT CG4 LACTIC ACID, ED
LACTIC ACID, VENOUS: 2.47 mmol/L — AB (ref 0.5–1.9)
Lactic Acid, Venous: 3.22 mmol/L (ref 0.5–1.9)

## 2018-02-07 LAB — MRSA PCR SCREENING: MRSA by PCR: NEGATIVE

## 2018-02-07 LAB — BRAIN NATRIURETIC PEPTIDE: B Natriuretic Peptide: 506 pg/mL — ABNORMAL HIGH (ref 0.0–100.0)

## 2018-02-07 LAB — TROPONIN I: Troponin I: <0.03 ng/mL (ref <0.03)

## 2018-02-07 MED ORDER — LORAZEPAM 2 MG/ML IJ SOLN
0.5000 mg | Freq: Once | INTRAMUSCULAR | Status: AC | PRN
  Administered 2018-02-07: 0.5 mg via INTRAVENOUS
  Filled 2018-02-07: qty 1

## 2018-02-07 MED ORDER — LEVOTHYROXINE SODIUM 25 MCG PO TABS
25.0000 ug | ORAL_TABLET | Freq: Every day | ORAL | Status: DC
  Filled 2018-02-07: qty 1

## 2018-02-07 MED ORDER — MOMETASONE FURO-FORMOTEROL FUM 200-5 MCG/ACT IN AERO
2.0000 | INHALATION_SPRAY | Freq: Two times a day (BID) | RESPIRATORY_TRACT | Status: DC
  Filled 2018-02-07: qty 8.8

## 2018-02-07 MED ORDER — SODIUM CHLORIDE 0.9 % IV SOLN
1.0000 g | Freq: Once | INTRAVENOUS | Status: AC
  Administered 2018-02-07: 1 g via INTRAVENOUS
  Filled 2018-02-07: qty 10

## 2018-02-07 MED ORDER — METOPROLOL TARTRATE 5 MG/5ML IV SOLN
2.5000 mg | Freq: Four times a day (QID) | INTRAVENOUS | Status: DC
  Administered 2018-02-07: 2.5 mg via INTRAVENOUS
  Filled 2018-02-07: qty 5

## 2018-02-07 MED ORDER — VANCOMYCIN HCL IN DEXTROSE 1-5 GM/200ML-% IV SOLN
1000.0000 mg | Freq: Once | INTRAVENOUS | Status: AC
  Administered 2018-02-07: 1000 mg via INTRAVENOUS
  Filled 2018-02-07: qty 200

## 2018-02-07 MED ORDER — ESCITALOPRAM OXALATE 10 MG PO TABS: 10.0000 mg | ORAL_TABLET | Freq: Every day | ORAL | Status: DC

## 2018-02-07 MED ORDER — VANCOMYCIN HCL IN DEXTROSE 750-5 MG/150ML-% IV SOLN
750.0000 mg | INTRAVENOUS | Status: DC
  Filled 2018-02-07 (×2): qty 150

## 2018-02-07 MED ORDER — SODIUM CHLORIDE 0.9 % IV BOLUS
1000.0000 mL | Freq: Once | INTRAVENOUS | Status: AC
  Administered 2018-02-07: 1000 mL via INTRAVENOUS

## 2018-02-07 MED ORDER — SODIUM CHLORIDE 0.9 % IV BOLUS
2000.0000 mL | Freq: Once | INTRAVENOUS | Status: AC
  Administered 2018-02-07: 2000 mL via INTRAVENOUS

## 2018-02-07 MED ORDER — LORAZEPAM 2 MG/ML IJ SOLN
0.5000 mg | INTRAMUSCULAR | Status: DC | PRN
  Administered 2018-02-07 – 2018-02-11 (×6): 0.5 mg via INTRAVENOUS
  Filled 2018-02-07 (×6): qty 1

## 2018-02-07 MED ORDER — DILTIAZEM HCL 25 MG/5ML IV SOLN
10.0000 mg | Freq: Once | INTRAVENOUS | Status: AC
  Administered 2018-02-07: 10 mg via INTRAVENOUS
  Filled 2018-02-07: qty 5

## 2018-02-07 MED ORDER — SODIUM CHLORIDE 0.9 % IV SOLN
1.0000 g | Freq: Three times a day (TID) | INTRAVENOUS | Status: DC
  Administered 2018-02-07 – 2018-02-08 (×2): 1 g via INTRAVENOUS
  Filled 2018-02-07 (×11): qty 1

## 2018-02-07 MED ORDER — ENOXAPARIN SODIUM 60 MG/0.6ML ~~LOC~~ SOLN
50.0000 mg | SUBCUTANEOUS | Status: DC
  Administered 2018-02-07 – 2018-02-09 (×3): 50 mg via SUBCUTANEOUS
  Filled 2018-02-07 (×3): qty 0.6

## 2018-02-07 MED ORDER — LORAZEPAM 2 MG/ML IJ SOLN: 1.0000 mg | Freq: Once | INTRAMUSCULAR | Status: DC | PRN

## 2018-02-07 MED ORDER — ORAL CARE MOUTH RINSE
15.0000 mL | Freq: Two times a day (BID) | OROMUCOSAL | Status: DC
  Administered 2018-02-07 – 2018-02-11 (×8): 15 mL via OROMUCOSAL

## 2018-02-07 MED ORDER — METOPROLOL TARTRATE 5 MG/5ML IV SOLN
5.0000 mg | Freq: Four times a day (QID) | INTRAVENOUS | Status: DC
  Administered 2018-02-07 – 2018-02-08 (×5): 5 mg via INTRAVENOUS
  Filled 2018-02-07 (×7): qty 5

## 2018-02-07 MED ORDER — SODIUM CHLORIDE 0.9 % IV SOLN
500.0000 mg | INTRAVENOUS | Status: DC
  Administered 2018-02-07: 500 mg via INTRAVENOUS
  Filled 2018-02-07 (×4): qty 500

## 2018-02-07 MED ORDER — DEXTROSE 5 % IV SOLN
INTRAVENOUS | Status: DC
  Administered 2018-02-07: 19:00:00 via INTRAVENOUS

## 2018-02-07 MED ORDER — IPRATROPIUM-ALBUTEROL 0.5-2.5 (3) MG/3ML IN SOLN
3.0000 mL | Freq: Four times a day (QID) | RESPIRATORY_TRACT | Status: DC
  Administered 2018-02-07 – 2018-02-11 (×16): 3 mL via RESPIRATORY_TRACT
  Filled 2018-02-07 (×15): qty 3

## 2018-02-07 NOTE — ED Notes (Signed)
Notified Angie at [facility] that pt will be admitted tonight.

## 2018-02-07 NOTE — ED Notes (Addendum)
Patient becoming increasingly disoriented. Patient pulling O2 off constantly.

## 2018-02-07 NOTE — ED Triage Notes (Signed)
Pt brought in by EMS from [facility] due to altered mental status. Report received from nurse at nursing home. Reports pt has been declining since Monday. Normally independent, but now non ambulatory , incontinent and decline in appetite . Pt incontinent of urine at this time. Foul smelling urine. Bolus of 200 ml fluid given by EMS. ALso reported new onset afib

## 2018-02-07 NOTE — ED Notes (Signed)
CRITICAL VALUE ALERT  Critical Value:  Lactic 3.22  Date & Time Notied:  1527 02/24/2018  Provider Notified: [email or web address]  Orders Received/Actions taken:

## 2018-02-07 NOTE — ED Notes (Signed)
Dr Zammit at bedside. 

## 2018-02-07 NOTE — ED Notes (Signed)
Brandy from [facility] called for update on pt. Gave report and notified her that patient would be admitted to hospital.

## 2018-02-07 NOTE — ED Notes (Signed)
Pt starting to calm down after Ativan given.

## 2018-02-07 NOTE — H&P (Signed)
History and Physical  Karen Larson QMV:[id number] DOB: Sep 22, 1940 DOA: 02-26-2018  Referring physician: Estell Harpin MD PCP: Roger Kill, PA-C   Chief Complaint: increasing confusion   Historian: History taken from patient's sister, ED physician and records.  Patient unable to participate  HPI: Karen Larson is a 77 y.o. female with dementia and resident of [facility] skilled nursing facility was sent in from the nursing home because they have reported that she has had a decline for the past 4 days where she normally had been independent and now has become nonambulatory not eating or drinking and incontinent of urine.  There have been no fevers reported.  No changes in medications reported.  Of note, the patient's sister reports that her cardiologist had notified her that they picked up atrial fibrillation on her planted monitor and they wanted her to come in to discuss anticoagulation.  Unfortunately the patient has ended up in the hospital before she has been able to see her cardiologist.  The patient sister reports that for several weeks the patient has had changes in personality increasing confusion and has been clawing at her legs from severe pruritus and also scratching at her upper trunk.  She was treated for scabies.  She also has been treated with a topical Sarna lotion.  The patient has not been scratching in the past 2 weeks according to sister.  She also reports that the patient has had very minimal oral intake in the last 2 weeks and has been basically refusing to eat.  She was holding food in her mouth and not swallowing.  The patient apparently had been treated recently for an oral thrush with nystatin swish and swallow however the patient refused to take the medication in the last couple of days.  ED course: The patient was evaluated in the ED and noted to be severely clinically dehydrated.  The patient had a sodium level of 151.  The patient's chest x-ray was suggestive  of pneumonia.  Urinalysis was also abnormal.  The patient was noted to have a white blood cell count of 25.  The patient was started on antibiotics.  Blood cultures were taken.  The patient was given a fluid bolus and admission was requested.    Review of Systems: Unable to obtain due to severe dementia.  Past Medical History:  Diagnosis Date  . Anemia   . Aortic valve stenosis   . Arthritis   . Atrial fibrillation (HCC)   . Cardiomyopathy (HCC)   . CHF (congestive heart failure) (HCC)   . COPD (chronic obstructive pulmonary disease) (HCC)   . Dementia (HCC)   . Falls   . Hypertension   . Hypothyroidism   . MDD (major depressive disorder)   . Nicotine dependence   . Thyroid disease    Past Surgical History:  Procedure Laterality Date  . ABDOMINAL HYSTERECTOMY    . APPENDECTOMY    . CARDIAC SURGERY     valve replacement  . PACEMAKER INSERTION     Social History:  reports that she has quit smoking. She has never used smokeless tobacco. She reports that she drank alcohol. She reports that she does not use drugs.  Allergies  Allergen Reactions  . Tiotropium Bromide Monohydrate Nausea Only  . Doxycycline Nausea Only  . Levaquin  [Levofloxacin] Nausea Only    History reviewed. No pertinent family history.  Prior to Admission medications   Medication Sig Start Date End Date Taking? Authorizing Provider  aspirin  81 MG chewable tablet Chew 81 mg by mouth every other day.   Yes [provider]  Cholecalciferol (VITAMIN D3) 50 MCG (2000 UT) capsule Take 2,000 Units by mouth daily.   Yes [provider]  diphenhydrAMINE (BENADRYL) 25 MG tablet Take 25 mg by mouth every 8 (eight) hours as needed.   Yes [provider]  escitalopram (LEXAPRO) 20 MG tablet Take 10 mg by mouth daily.    Yes [provider]  Fluticasone-Salmeterol (WIXELA INHUB) 250-50 MCG/DOSE AEPB Inhale 1 puff into the lungs 2 (two) times daily.   Yes [provider]    ibuprofen (ADVIL,MOTRIN) 800 MG tablet Take 800 mg by mouth daily as needed.  05/25/16  Yes [provider]  levothyroxine (SYNTHROID, LEVOTHROID) 25 MCG tablet  02/04/18  Yes [provider]  pramoxine (SARNA SENSITIVE) 1 % LOTN Apply 1 application topically 3 (three) times daily.   Yes [provider]  nystatin (MYCOSTATIN) 100000 UNIT/ML suspension Take 5 mLs by mouth 4 (four) times daily.  02/04/18   [provider]   Physical Exam: Vitals:   03/03/2018 1330 03/02/2018 1400 02/21/2018 1430 02/21/2018 1444  BP: 115/73 135/83 (!) 134/118   Pulse:      Resp: (!) 26 (!) 31 (!) 27   Temp:      TempSrc:      SpO2:    95%  Weight:         General exam: Patient appears chronically ill and severely dehydrated with very dry lips a thick coating over the teeth and thick mucus in the mouth, lying comfortably supine on the gurney in no obvious distress.  She is confused and demented with confused thoughts but speaks clearly.  Head, eyes and ENT: Nontraumatic and normocephalic. Pupils equally reacting to light and accommodation. Oral mucosa pale dry with thick whitish secretion, poor dentition, upper denture and some lower teeth that are brown and thick plaque and gingivitis seen.  No specific oral thrush was seen.  Neck: Supple. No JVD, carotid bruit or thyromegaly.  Lymphatics: No lymphadenopathy.  Respiratory system: Clear to auscultation. No increased work of breathing.  Cardiovascular system: S1 and S2 heard, irregularly irregular no JVD, murmurs, gallops, clicks or pedal edema.  Gastrointestinal system: Abdomen is nondistended, soft and nontender. Normal bowel sounds heard. No organomegaly or masses appreciated.  Central nervous system: Alert but very confused with dementia. No focal neurological deficits.  Moving all extremities but not cooperative for exam.  Extremities: Symmetric 5 x 5 power. Peripheral pulses symmetrically felt.   Skin: No rashes or  acute findings.  Musculoskeletal system: Negative exam.  Psychiatry: Pleasant and cooperative.  Labs on Admission:  Basic Metabolic Panel: Recent Labs  Lab 02/10/2018 1305 02/21/2018 1338  NA 149* 151*  K 3.4* 3.6  CL 118* 116*  CO2 21*  --   GLUCOSE 102* 98  BUN 26* 29*  CREATININE 1.01* 1.00  CALCIUM 8.0*  --    Liver Function Tests: Recent Labs  Lab 02/20/2018 1305  AST 43*  ALT 44  ALKPHOS 121  BILITOT 0.5  PROT 6.5  ALBUMIN 2.4*   No results for input(s): LIPASE, AMYLASE in the last 168 hours. No results for input(s): AMMONIA in the last 168 hours. CBC: Recent Labs  Lab 02/05/2018 1305 02/17/2018 1338  WBC 25.2*  --   NEUTROABS 22.4*  --   HGB 10.2* 10.9*  HCT 34.8* 32.0*  MCV 80.9  --   PLT 554*  --  Cardiac Enzymes: Recent Labs  Lab 02/17/2018 1305  TROPONINI <0.03    BNP (last 3 results) No results for input(s): PROBNP in the last 8760 hours. CBG: No results for input(s): GLUCAP in the last 168 hours.  Radiological Exams on Admission: Dg Chest Portable 1 View  Result Date: 02-17-18 CLINICAL DATA:  Acute shortness of breath. EXAM: PORTABLE CHEST 1 VIEW COMPARISON:  08/05/2017 FINDINGS: Diffuse airspace opacities within the mid and LOWER RIGHT lung noted with small RIGHT pleural effusion. Cardiomediastinal silhouette is unchanged. Aortic valve replacement and LEFT-sided pacemaker again noted. Mild vascular congestion noted. There is no evidence of pneumothorax. IMPRESSION: RIGHT lung airspace opacities, question infection versus asymmetric edema. Small RIGHT pleural effusion. Mild pulmonary vascular congestion. Electronically Signed   By: Harmon Pier M.D.   On: 17-Feb-2018 13:31   EKG: Independently reviewed.   Assessment/Plan Principal Problem:   Severe sepsis (HCC) Active Problems:   Cardiomyopathy (HCC)   Hypernatremia   Severe dehydration   Leukocytosis   Elevated lactic acid level   HCAP (healthcare-associated pneumonia)   Chronic  obstructive pulmonary disease (HCC)   Congestive heart failure (HCC)   Protein-calorie malnutrition, severe   S/P TAVR (transcatheter aortic valve replacement)  1. Severe sepsis - suspect secondary to healthcare associated pneumonia patient is from [facility] and will start IV antibiotics, IV fluids supportive care and follow lactate and trend till it is improved. 2. Hypernatremia-secondary to severe dehydration, treating with hypotonic fluids and follow sodium level. 3. Leukocytosis- very high WBC likely secondary to sepsis and pneumonia, trend CBC with differential. 4. Severe protein calorie malnutrition-dietitian consult requested.  Pt has been refusing to eat and drink in last few days. I spoke with sister at length and she is unsure if she wants to pursue PEG placement if patient does not eat/drink.  I have asked for palliative medicine consult for goals of care.  5. Chronic atrial fibrillation-recently diagnosed- I spoke with her sister about anticoagulation and hospital she needs time to think about if she wants to pursue this as her sister is a fall risk. 6. Chronic systolic heart failure - monitor fluids, given severe sepsis will need to fluid resuscitate, diurese as needed.  7. COPD - resume home respiratory medications, scheduled nebs ordered.  8. HCAP - antibiotics as ordered.  Follow blood and sputum cultures.  9. Abnormal urinalysis - unsure if patient has symptoms given dementia, she is on IV antibiotics.  10. Metabolic encephalopathy - treating as above.  Follow 11. Dementia - lorazepam ordered for agitation.  12. Acute confusional state - worsening mentation in last few days, it is very possible that patient had a stroke given atrial fibrillation.  Obtain CT head, if no hemorrhage, will ask pharmacy to fully anticoagulate with lovenox until family decides about full anticoagulation.  I think a palliative medicine consult for goals of care is in order.    DVT Prophylaxis: lovenox   Code Status: full   Family Communication: sister at bedside  Disposition Plan: SNF when medically stabilized   Time spent: 60 mins  Standley Dakins, MD [facility] Pager [phone]  If 7PM-7AM, please contact night-coverage [email or web address] Password [facility] 02-17-18, 3:47 PM

## 2018-02-07 NOTE — Progress Notes (Signed)
Palliative Medicine Team  PMT not at [facility] over the weekend but will arrange goals of care with patient and family on Monday. Thank you for the opportunity to participate in the care of Ms. Tristan.  Vennie Homans, DNP, FNP-C Palliative Medicine Team  Phone: [phone] Fax: [phone]

## 2018-02-07 NOTE — ED Provider Notes (Signed)
[facility] EMERGENCY DEPARTMENT Provider Note   CSN: [id number] Arrival date & time: 02/19/2018  1232     History   Chief Complaint Chief Complaint  Patient presents with  . Altered Mental Status    HPI Karen Larson is a 77 y.o. female.  Patient presents to the emergency department more confused than normal.  She has not been eating or drinking for a number days.  Patient has a history of COPD and heart failure and states that a nursing home  The history is provided by the patient. No language interpreter was used.  Altered Mental Status   This is a new problem. The current episode started more than 2 days ago. The problem has not changed since onset.Associated symptoms include confusion. Risk factors: COPD congestive heart failure. Her past medical history does not include seizures.    Past Medical History:  Diagnosis Date  . Anemia   . Aortic valve stenosis   . Arthritis   . Atrial fibrillation (HCC)   . Cardiomyopathy (HCC)   . CHF (congestive heart failure) (HCC)   . COPD (chronic obstructive pulmonary disease) (HCC)   . Dementia (HCC)   . Falls   . Hypertension   . Hypothyroidism   . MDD (major depressive disorder)   . Nicotine dependence   . Thyroid disease     Patient Active Problem List   Diagnosis Date Noted  . Severe sepsis (HCC) 02/05/2018  . Hypernatremia 03/03/2018  . Severe dehydration 02/12/2018  . Leukocytosis 03/02/2018  . Elevated lactic acid level 02/14/2018  . COPD with acute bronchitis (HCC) 08/06/2017  . Sepsis (HCC) 08/06/2017  . 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 Malnutrition of moderate degree 08/03/2017  . Protein-calorie malnutrition, severe 08/09/2016  . Hypokalemia 08/08/2016  . Hypokalemia due to loss of potassium 08/08/2016  . Dyspnea on exertion 08/08/2016  . Chronic obstructive pulmonary disease (HCC) 08/07/2016  . Congestive heart failure (HCC) 08/07/2016  . Cardiomyopathy (HCC) 10/29/2012    . CHF (congestive heart failure) (HCC) 08/18/2012    Past Surgical History:  Procedure Laterality Date  . ABDOMINAL HYSTERECTOMY    . APPENDECTOMY    . CARDIAC SURGERY     valve replacement  . PACEMAKER INSERTION       OB History   None      Home Medications    Prior to Admission medications   Medication Sig Start Date End Date Taking? Authorizing Provider  albuterol (PROVENTIL) (2.5 MG/3ML) 0.083% nebulizer solution Take 3 mLs (2.5 mg total) by nebulization every 2 (two) hours as needed for wheezing or shortness of breath. 08/06/17   Calvert Cantor, MD  aspirin 81 MG chewable tablet Chew 81 mg by mouth every other day.    [provider]  atorvastatin (LIPITOR) 40 MG tablet Take 40 mg by mouth daily.    [provider]  azithromycin (ZITHROMAX) 250 MG tablet Take 1 tablet (250 mg total) by mouth daily. 08/06/17   Calvert Cantor, MD  budesonide-formoterol (SYMBICORT) 80-4.5 MCG/ACT inhaler Inhale 2 puffs into the lungs 2 (two) times daily. 08/06/17   Calvert Cantor, MD  escitalopram (LEXAPRO) 20 MG tablet Take 20 mg by mouth daily.    [provider]    Family History No family history on file.  Social History Social History   Tobacco Use  . Smoking status: Former Games developer  . Smokeless tobacco: Never Used  Substance Use Topics  . Alcohol use: Not Currently  . Drug  use: No     Allergies   Tiotropium bromide monohydrate; Doxycycline; and Levaquin  [levofloxacin]   Review of Systems Review of Systems  Unable to perform ROS: Mental status change  Psychiatric/Behavioral: Positive for confusion.     Physical Exam Updated Vital Signs BP 121/77 (BP Location: Right Arm)   Pulse (!) 128   Temp 100 F (37.8 C) (Rectal)   Resp (!) 25   Wt 51.3 kg   SpO2 90%   BMI 17.98 kg/m   Physical Exam  Constitutional: She appears well-developed.  HENT:  Head: Normocephalic.  Dry mucous membrane  Eyes: Conjunctivae and EOM are normal. No scleral  icterus.  Neck: Neck supple. No thyromegaly present.  Cardiovascular: Exam reveals no gallop and no friction rub.  No murmur heard. Rapid irregular rate  Pulmonary/Chest: No stridor. She has no wheezes. She has rales. She exhibits no tenderness.  Abdominal: She exhibits no distension. There is no tenderness. There is no rebound.  Musculoskeletal: Normal range of motion. She exhibits no edema.  Lymphadenopathy:    She has no cervical adenopathy.  Neurological: She exhibits normal muscle tone. Coordination normal.  Lethargic and confused  Skin: No rash noted. No erythema.     ED Treatments / Results  Labs (all labs ordered are listed, but only abnormal results are displayed) Labs Reviewed  COMPREHENSIVE METABOLIC PANEL - Abnormal; Notable for the following components:      Result Value   Sodium 149 (*)    Potassium 3.4 (*)    Chloride 118 (*)    CO2 21 (*)    Glucose, Bld 102 (*)    BUN 26 (*)    Creatinine, Ser 1.01 (*)    Calcium 8.0 (*)    Albumin 2.4 (*)    AST 43 (*)    GFR calc non Af Amer 54 (*)    All other components within normal limits  CBC WITH DIFFERENTIAL/PLATELET - Abnormal; Notable for the following components:   WBC 25.2 (*)    Hemoglobin 10.2 (*)    HCT 34.8 (*)    MCH 23.7 (*)    MCHC 29.3 (*)    RDW 19.3 (*)    Platelets 554 (*)    nRBC 0.4 (*)    Neutro Abs 22.4 (*)    Monocytes Absolute 1.3 (*)    Abs Immature Granulocytes 0.42 (*)    All other components within normal limits  URINALYSIS, ROUTINE W REFLEX MICROSCOPIC - Abnormal; Notable for the following components:   Color, Urine AMBER (*)    APPearance CLOUDY (*)    Hgb urine dipstick SMALL (*)    Ketones, ur 5 (*)    Protein, ur 30 (*)    All other components within normal limits  BRAIN NATRIURETIC PEPTIDE - Abnormal; Notable for the following components:   B Natriuretic Peptide 506.0 (*)    All other components within normal limits  I-STAT CG4 LACTIC ACID, ED - Abnormal; Notable for the  following components:   Lactic Acid, Venous 2.47 (*)    All other components within normal limits  I-STAT CHEM 8, ED - Abnormal; Notable for the following components:   Sodium 151 (*)    Chloride 116 (*)    BUN 29 (*)    Calcium, Ion 1.09 (*)    Hemoglobin 10.9 (*)    HCT 32.0 (*)    All other components within normal limits  CULTURE, BLOOD (ROUTINE X 2)  CULTURE, BLOOD (ROUTINE X 2)  TROPONIN I  I-STAT CG4 LACTIC ACID, ED    EKG None  Radiology Dg Chest Portable 1 View  Result Date: 02-09-18 CLINICAL DATA:  Acute shortness of breath. EXAM: PORTABLE CHEST 1 VIEW COMPARISON:  08/05/2017 FINDINGS: Diffuse airspace opacities within the mid and LOWER RIGHT lung noted with small RIGHT pleural effusion. Cardiomediastinal silhouette is unchanged. Aortic valve replacement and LEFT-sided pacemaker again noted. Mild vascular congestion noted. There is no evidence of pneumothorax. IMPRESSION: RIGHT lung airspace opacities, question infection versus asymmetric edema. Small RIGHT pleural effusion. Mild pulmonary vascular congestion. Electronically Signed   By: Harmon Pier M.D.   On: 2018-02-09 13:31    Procedures Procedures (including critical care time)  Medications Ordered in ED Medications  sodium chloride 0.9 % bolus 2,000 mL (2,000 mLs Intravenous New Bag/Given 2018-02-09 1334)  azithromycin (ZITHROMAX) 500 mg in sodium chloride 0.9 % 250 mL IVPB (500 mg Intravenous New Bag/Given 02-09-2018 1418)  cefTRIAXone (ROCEPHIN) 1 g in sodium chloride 0.9 % 100 mL IVPB (0 g Intravenous Stopped 02-09-18 1419)     Initial Impression / Assessment and Plan / ED Course  I have reviewed the triage vital signs and the nursing notes.  Pertinent labs & imaging results that were available during my care of the patient were reviewed by me and considered in my medical decision making (see chart for details).    CRITICAL CARE Performed by: Bethann Berkshire Total critical care time: 40 minutes Critical care  time was exclusive of separately billable procedures and treating other patients. Critical care was necessary to treat or prevent imminent or life-threatening deterioration. Critical care was time spent personally by me on the following activities: development of treatment plan with patient and/or surrogate as well as nursing, discussions with consultants, evaluation of patient's response to treatment, examination of patient, obtaining history from patient or surrogate, ordering and performing treatments and interventions, ordering and review of laboratory studies, ordering and review of radiographic studies, pulse oximetry and re-evaluation of patient's condition.  Patient with pneumonia and sepsis.  She will be admitted to hospitalist Final Clinical Impressions(s) / ED Diagnoses   Final diagnoses:  Community acquired pneumonia of right upper lobe of lung [facility])    ED Discharge Orders    None       Bethann Berkshire, MD Feb 09, 2018 1429

## 2018-02-07 NOTE — ED Notes (Signed)
CRITICAL VALUE ALERT  Critical Value:  Lactic 2.47 Date & Time Notied:  1334 02/10/2018  Provider Notified: dr Estell Harpin  Orders Received/Actions taken:

## 2018-02-07 NOTE — Progress Notes (Signed)
Pharmacy Antibiotic Note  Karen Larson is a 77 y.o. female admitted on 02/21/2018 with pneumonia.  Pharmacy has been consulted for vancomycin dosing.  Patient is a resident of [facility].  Plan: Loading dose:  vancomycin 1g x1 dose now Maintenance dose:  vancomycin 750mg  IV q24h Goal vancomycin  trough range: 15-20   mcg/mL Pharmacy will continue to monitor renal function, vancomycin troughs as clinically indicated, cultures and patient progress.  Weight: 113 lb 1.5 oz (51.3 kg)  Temp (24hrs), Avg:100 F (37.8 C), Min:100 F (37.8 C), Max:100 F (37.8 C)  Recent Labs  Lab 21-Feb-2018 1305 02-21-2018 1325 02/21/18 1338  WBC 25.2*  --   --   CREATININE 1.01*  --  1.00  LATICACIDVEN  --  2.47*  --     CrCl cannot be calculated (Unknown ideal weight.).    Allergies  Allergen Reactions  . Tiotropium Bromide Monohydrate Nausea Only  . Doxycycline Nausea Only  . Levaquin  [Levofloxacin] Nausea Only    Antimicrobials this admission: 12/6 vancomycin >>   12/6 azithromycin >>    Microbiology results:  12/6 [facility] x2: sent    Thank you for allowing pharmacy to be a part of this patient's care.  Tama High 02/21/18 3:10 PM

## 2018-02-08 ENCOUNTER — Inpatient Hospital Stay (HOSPITAL_COMMUNITY): Payer: Medicare Other

## 2018-02-08 DIAGNOSIS — A419 Sepsis, unspecified organism: Principal | ICD-10-CM

## 2018-02-08 DIAGNOSIS — E870 Hyperosmolality and hypernatremia: Secondary | ICD-10-CM

## 2018-02-08 DIAGNOSIS — R6520 Severe sepsis without septic shock: Secondary | ICD-10-CM

## 2018-02-08 DIAGNOSIS — J189 Pneumonia, unspecified organism: Secondary | ICD-10-CM

## 2018-02-08 DIAGNOSIS — E860 Dehydration: Secondary | ICD-10-CM

## 2018-02-08 DIAGNOSIS — E43 Unspecified severe protein-calorie malnutrition: Secondary | ICD-10-CM

## 2018-02-08 DIAGNOSIS — J449 Chronic obstructive pulmonary disease, unspecified: Secondary | ICD-10-CM

## 2018-02-08 LAB — COMPREHENSIVE METABOLIC PANEL
ALBUMIN: 2.3 g/dL — AB (ref 3.5–5.0)
ALT: 64 U/L — AB (ref 0–44)
AST: 75 U/L — AB (ref 15–41)
Alkaline Phosphatase: 114 U/L (ref 38–126)
Anion gap: 9 (ref 5–15)
BUN: 25 mg/dL — AB (ref 8–23)
CO2: 18 mmol/L — ABNORMAL LOW (ref 22–32)
CREATININE: 1.1 mg/dL — AB (ref 0.44–1.00)
Calcium: 7.8 mg/dL — ABNORMAL LOW (ref 8.9–10.3)
Chloride: 122 mmol/L — ABNORMAL HIGH (ref 98–111)
GFR calc Af Amer: 56 mL/min — ABNORMAL LOW (ref >60)
GFR calc non Af Amer: 48 mL/min — ABNORMAL LOW (ref >60)
Glucose, Bld: 107 mg/dL — ABNORMAL HIGH (ref 70–99)
Potassium: 3.4 mmol/L — ABNORMAL LOW (ref 3.5–5.1)
Sodium: 149 mmol/L — ABNORMAL HIGH (ref 135–145)
TOTAL PROTEIN: 6 g/dL — AB (ref 6.5–8.1)
Total Bilirubin: 0.5 mg/dL (ref 0.3–1.2)

## 2018-02-08 LAB — CBC WITH DIFFERENTIAL/PLATELET
Abs Immature Granulocytes: 0.41 10*3/uL — ABNORMAL HIGH (ref 0.00–0.07)
BASOS ABS: 0.1 10*3/uL (ref 0.0–0.1)
Basophils Relative: 0 %
EOS PCT: 0 %
Eosinophils Absolute: 0 10*3/uL (ref 0.0–0.5)
HEMATOCRIT: 33.1 % — AB (ref 36.0–46.0)
Hemoglobin: 9.2 g/dL — ABNORMAL LOW (ref 12.0–15.0)
Immature Granulocytes: 2 %
Lymphocytes Relative: 3 %
Lymphs Abs: 0.7 10*3/uL (ref 0.7–4.0)
MCH: 23.2 pg — ABNORMAL LOW (ref 26.0–34.0)
MCHC: 27.8 g/dL — ABNORMAL LOW (ref 30.0–36.0)
MCV: 83.4 fL (ref 80.0–100.0)
Monocytes Absolute: 1.2 10*3/uL — ABNORMAL HIGH (ref 0.1–1.0)
Monocytes Relative: 5 %
Neutro Abs: 22.6 10*3/uL — ABNORMAL HIGH (ref 1.7–7.7)
Neutrophils Relative %: 90 %
Platelets: 454 10*3/uL — ABNORMAL HIGH (ref 150–400)
RBC: 3.97 MIL/uL (ref 3.87–5.11)
RDW: 19.9 % — ABNORMAL HIGH (ref 11.5–15.5)
WBC: 25 10*3/uL — ABNORMAL HIGH (ref 4.0–10.5)
nRBC: 0.6 % — ABNORMAL HIGH (ref 0.0–0.2)

## 2018-02-08 LAB — GLUCOSE, CAPILLARY
GLUCOSE-CAPILLARY: 78 mg/dL (ref 70–99)
GLUCOSE-CAPILLARY: 70 mg/dL (ref 70–99)
Glucose-Capillary: 116 mg/dL — ABNORMAL HIGH (ref 70–99)

## 2018-02-08 LAB — BLOOD GAS, ARTERIAL
Acid-base deficit: 4.8 mmol/L — ABNORMAL HIGH (ref 0.0–2.0)
Bicarbonate: 20.5 mmol/L (ref 20.0–28.0)
DRAWN BY: 234301
O2 Content: 5 L/min
O2 Saturation: 86.2 %
Patient temperature: 37
pCO2 arterial: 30 mmHg — ABNORMAL LOW (ref 32.0–48.0)
pH, Arterial: 7.416 (ref 7.350–7.450)
pO2, Arterial: 57.9 mmHg — ABNORMAL LOW (ref 83.0–108.0)

## 2018-02-08 LAB — RESPIRATORY PANEL BY PCR
Adenovirus: NOT DETECTED
Bordetella pertussis: NOT DETECTED
CORONAVIRUS OC43-RVPPCR: NOT DETECTED
Chlamydophila pneumoniae: NOT DETECTED
Coronavirus 229E: NOT DETECTED
Coronavirus HKU1: NOT DETECTED
Coronavirus NL63: NOT DETECTED
INFLUENZA B-RVPPCR: NOT DETECTED
Influenza A: NOT DETECTED
Metapneumovirus: NOT DETECTED
Mycoplasma pneumoniae: NOT DETECTED
Parainfluenza Virus 1: NOT DETECTED
Parainfluenza Virus 2: NOT DETECTED
Parainfluenza Virus 3: NOT DETECTED
Parainfluenza Virus 4: NOT DETECTED
Respiratory Syncytial Virus: NOT DETECTED
Rhinovirus / Enterovirus: NOT DETECTED

## 2018-02-08 LAB — BRAIN NATRIURETIC PEPTIDE: B Natriuretic Peptide: 583 pg/mL — ABNORMAL HIGH (ref 0.0–100.0)

## 2018-02-08 LAB — MAGNESIUM: Magnesium: 2 mg/dL (ref 1.7–2.4)

## 2018-02-08 MED ORDER — SODIUM CHLORIDE 0.45 % IV BOLUS
500.0000 mL | Freq: Once | INTRAVENOUS | Status: AC
  Administered 2018-02-08: 500 mL via INTRAVENOUS

## 2018-02-08 MED ORDER — KCL IN DEXTROSE-NACL 20-5-0.45 MEQ/L-%-% IV SOLN
INTRAVENOUS | Status: DC
  Administered 2018-02-08 – 2018-02-11 (×5): via INTRAVENOUS

## 2018-02-08 MED ORDER — DILTIAZEM HCL-DEXTROSE 100-5 MG/100ML-% IV SOLN (PREMIX)
5.0000 mg/h | INTRAVENOUS | Status: DC
  Administered 2018-02-08: 5 mg/h via INTRAVENOUS
  Administered 2018-02-08: 15 mg/h via INTRAVENOUS
  Filled 2018-02-08 (×2): qty 100

## 2018-02-08 MED ORDER — BUDESONIDE 0.25 MG/2ML IN SUSP
0.2500 mg | Freq: Two times a day (BID) | RESPIRATORY_TRACT | Status: DC
  Administered 2018-02-08 – 2018-02-11 (×7): 0.25 mg via RESPIRATORY_TRACT
  Filled 2018-02-08 (×7): qty 2

## 2018-02-08 MED ORDER — PIPERACILLIN-TAZOBACTAM 3.375 G IVPB
3.3750 g | Freq: Three times a day (TID) | INTRAVENOUS | Status: DC
  Administered 2018-02-08 – 2018-02-11 (×9): 3.375 g via INTRAVENOUS
  Filled 2018-02-08 (×9): qty 50

## 2018-02-08 MED ORDER — POTASSIUM CHLORIDE 2 MEQ/ML IV SOLN
INTRAVENOUS | Status: DC
  Filled 2018-02-08 (×4): qty 1000

## 2018-02-08 MED ORDER — METHYLPREDNISOLONE SODIUM SUCC 125 MG IJ SOLR
60.0000 mg | Freq: Two times a day (BID) | INTRAMUSCULAR | Status: DC
  Administered 2018-02-08 – 2018-02-11 (×6): 60 mg via INTRAVENOUS
  Filled 2018-02-08 (×6): qty 2

## 2018-02-08 NOTE — Progress Notes (Signed)
Pt HR 130-140s during this shift. Notified Dr. Kerry Hough gave orders for foley to monitor strict I&Os, will place on BIPAP and give 1L 1/2 NS bolus. Pt responded well to therapy and is not laboring to breathe as much. HR now 96. O2 sat has not been reading well despite best efforts to try different sites including hands, ear and toe. When pt has good pleth O2 sat 90-95%. Notified pt's sister Haroldine Laws of pt's status.

## 2018-02-08 NOTE — Progress Notes (Signed)
Patient appears to have rt side pneumonia. She is still lethargic. She is in for sepsis and her BNP is elevated at arrival She is 2-3 liters ahead. Hx of dementia and smoking. Aspiration may be a concern looking also for flu. Urine has yeast no bacteria.at present. She cannot do dulera mdi may need change to Pulmicort. Neb.

## 2018-02-08 NOTE — Progress Notes (Signed)
Pharmacy Antibiotic Note  Karen Larson is a 77 y.o. female admitted on 02/14/2018 with pneumonia.  Pharmacy has been consulted for zosyn dosing.  Plan: Zosyn 3.375g IV q8h (4 hour infusion).  Height: 5\' 6"  (167.6 cm)(Taken from 10/16 office visit) Weight: 112 lb 14 oz (51.2 kg) IBW/kg (Calculated) : 59.3  Temp (24hrs), Avg:99 F (37.2 C), Min:97.7 F (36.5 C), Max:100 F (37.8 C)  Recent Labs  Lab 02/17/2018 1305 02/10/2018 1325 02/03/2018 1338 02/21/2018 1527 02/08/18 0457  WBC 25.2*  --   --   --  25.0*  CREATININE 1.01*  --  1.00  --  1.10*  LATICACIDVEN  --  2.47*  --  3.22*  --     Estimated Creatinine Clearance: 34.6 mL/min (A) (by C-G formula based on SCr of 1.1 mg/dL (H)).    Allergies  Allergen Reactions  . Tiotropium Bromide Monohydrate Nausea Only  . Doxycycline Nausea Only  . Levaquin  [Levofloxacin] Nausea Only   Anti-infectives (From admission, onward)   Start     Dose/Rate Route Frequency Ordered Stop   02/08/18 1600  vancomycin (VANCOCIN) IVPB 750 mg/150 ml premix  Status:  Discontinued     750 mg 150 mL/hr over 60 Minutes Intravenous Every 24 hours 02/18/2018 1532 02/08/18 1156   02/08/18 1200  piperacillin-tazobactam (ZOSYN) IVPB 3.375 g     3.375 g 12.5 mL/hr over 240 Minutes Intravenous Every 8 hours 02/08/18 1159     02/04/2018 2200  ceFEPIme (MAXIPIME) 1 g in sodium chloride 0.9 % 100 mL IVPB  Status:  Discontinued     1 g 200 mL/hr over 30 Minutes Intravenous Every 8 hours 02/24/2018 1810 02/08/18 1156   02/06/2018 1600  vancomycin (VANCOCIN) IVPB 1000 mg/200 mL premix     1,000 mg 200 mL/hr over 60 Minutes Intravenous  Once 02/26/2018 1454 02/05/2018 1703   02/06/2018 1400  azithromycin (ZITHROMAX) 500 mg in sodium chloride 0.9 % 250 mL IVPB  Status:  Discontinued     500 mg 250 mL/hr over 60 Minutes Intravenous Every 24 hours 02/06/2018 1359 02/21/2018 1810   02/18/2018 1300  cefTRIAXone (ROCEPHIN) 1 g in sodium chloride 0.9 % 100 mL IVPB     1 g 200 mL/hr over 30  Minutes Intravenous  Once 02/09/2018 1255 03/04/2018 1419      Antimicrobials this admission: 12/7 zosyn >> 12/6 vancomycin >> 12/6 12/6 azithromycin >> 12/6 12/6 ceftriaxone >> 12/6   Microbiology results: 12/6 BCx: ngtd 12/6 UCx: ngtd  12/6 Sputum: sent  12/6 MRSA PCR: negative   Thank you for allowing pharmacy to be a part of this patient's care.  Gerre Pebbles Arsenio Schnorr 02/08/2018 12:00 PM

## 2018-02-08 NOTE — Progress Notes (Signed)
RT nasal tracheal suctioned patient with 14 fr catheter through the left nare. RT suctioned moderate amount of thick yellow secretions, no complications were noted. RN at bedside. Patient was later placed on BIPAP. RT will continue to monitor

## 2018-02-08 NOTE — Progress Notes (Signed)
PROGRESS NOTE    Karen Larson  ZOX:[id number] DOB: [birth year] DOA: 02/25/2018 PCP: Roger Kill, PA-C    Brief Narrative:  77 y/o female with a history of advanced dementia who is a resident of a nursing facility, is brought to the hospital with general decline for approximately 4 days prior to admission.  She was admitted to the hospital with sepsis, dehydration and pneumonia.  She was started on intravenous fluids and antibiotics.  The patient has been refusing to eat and drink prior to admission.  This is likely progression of her dementia.  Prognosis is guarded.   Assessment & Plan:   Principal Problem:   Severe sepsis (HCC) Active Problems:   Cardiomyopathy (HCC)   Chronic obstructive pulmonary disease (HCC)   Congestive heart failure (HCC)   Protein-calorie malnutrition, severe   S/P TAVR (transcatheter aortic valve replacement)   Hypernatremia   Severe dehydration   Leukocytosis   Elevated lactic acid level   HCAP (healthcare-associated pneumonia)   1. Sepsis secondary to healthcare associated pneumonia.  Continued on intravenous antibiotics, IV fluids.  Blood cultures are shown no growth, urine culture in process.  Respiratory viral panel in process. 2. Pneumonia, healthcare associated pneumonia versus aspiration pneumonia.  MRSA PCR is negative, discontinue vancomycin.  Will change cefepime to Zosyn for better anaerobic coverage. 3. COPD.  Has evidence of wheezing.  Will change Dulera to Pulmicort nebs.  Continue bronchodilators. 4. Hypernatremia.  Related to decreased p.o. intake and severe dehydration.  Mild improvement with hypotonic fluids. 5. Severe protein calorie malnutrition.  Related to decreased p.o. intake in the setting of advanced dementia.  I have advised against PEG tube placement, family is considering it. 6. Chronic atrial fibrillation.  Heart rate is currently stable.  Currently on full dose Lovenox.  Does not appear to be a candidate for  long-term anticoagulation with poor prognosis 7. Acute metabolic encephalopathy, superimposed on baseline dementia.  CT head does not show any acute findings.  The patient does have progressive dementia and has been refusing to eat or drink prior to admission. 8. Chronic combined CHF.  Ejection fraction 45 to 50% with grade 1 diastolic dysfunction.  Continue to monitor volume status in setting of hydration.   DVT prophylaxis: lovenox Code Status: full code Family Communication: discussed with sister mary over the phone Disposition Plan: pending hospital course, possible discharge back to SNF once respiratory status has stabilized vs. Hospice if continues to decline   Consultants:     Procedures:     Antimicrobials:   Cefepime 12/6>12/7  Vancomycin 12/6>12/7  Zosyn 12/7>   Subjective: Patient is lethargic, does not respond to questions or follows commands  Objective: Vitals:   02/08/18 0900 02/08/18 0959 02/08/18 1000 02/08/18 1100  BP: 112/71  126/77 112/65  Pulse:   (!) 114 (!) 25  Resp: (!) 30  (!) 28 (!) 27  Temp:      TempSrc:      SpO2:   95% (!) 84%  Weight:  51.2 kg    Height:  5\' 6"  (1.676 m)      Intake/Output Summary (Last 24 hours) at 02/08/2018 1207 Last data filed at 02/19/2018 1704 Gross per 24 hour  Intake 3550 ml  Output -  Net 3550 ml   Filed Weights   02/03/2018 1241 02/08/18 0600 02/08/18 0959  Weight: 51.3 kg 51.2 kg 51.2 kg    Examination:  General exam: somnolent, moans and groans to verbal stimuli Respiratory system: bilateral wheezes and  crackles. Respiratory effort normal. Cardiovascular system: S1 & S2 heard, RRR. No JVD, murmurs, rubs, gallops or clicks. No pedal edema. Gastrointestinal system: Abdomen is nondistended, soft and nontender. No organomegaly or masses felt. Normal bowel sounds heard. Central nervous system: No focal neurological deficits. Extremities: no edema bilaterally Skin: No rashes, lesions or  ulcers Psychiatry: somnolent    Data Reviewed: I have personally reviewed following labs and imaging studies  CBC: Recent Labs  Lab 2018/03/09 1305 03-09-2018 1338 02/08/18 0457  WBC 25.2*  --  25.0*  NEUTROABS 22.4*  --  22.6*  HGB 10.2* 10.9* 9.2*  HCT 34.8* 32.0* 33.1*  MCV 80.9  --  83.4  PLT 554*  --  454*   Basic Metabolic Panel: Recent Labs  Lab Mar 09, 2018 1305 03/09/2018 1338 02/08/18 0457  NA 149* 151* 149*  K 3.4* 3.6 3.4*  CL 118* 116* 122*  CO2 21*  --  18*  GLUCOSE 102* 98 107*  BUN 26* 29* 25*  CREATININE 1.01* 1.00 1.10*  CALCIUM 8.0*  --  7.8*  MG  --   --  2.0   GFR: Estimated Creatinine Clearance: 34.6 mL/min (A) (by C-G formula based on SCr of 1.1 mg/dL (H)). Liver Function Tests: Recent Labs  Lab 03/09/2018 1305 02/08/18 0457  AST 43* 75*  ALT 44 64*  ALKPHOS 121 114  BILITOT 0.5 0.5  PROT 6.5 6.0*  ALBUMIN 2.4* 2.3*   No results for input(s): LIPASE, AMYLASE in the last 168 hours. No results for input(s): AMMONIA in the last 168 hours. Coagulation Profile: No results for input(s): INR, PROTIME in the last 168 hours. Cardiac Enzymes: Recent Labs  Lab 03-09-2018 1305  TROPONINI <0.03   BNP (last 3 results) No results for input(s): PROBNP in the last 8760 hours. HbA1C: No results for input(s): HGBA1C in the last 72 hours. CBG: Recent Labs  Lab 02/08/18 0743  GLUCAP 116*   Lipid Profile: No results for input(s): CHOL, HDL, LDLCALC, TRIG, CHOLHDL, LDLDIRECT in the last 72 hours. Thyroid Function Tests: No results for input(s): TSH, T4TOTAL, FREET4, T3FREE, THYROIDAB in the last 72 hours. Anemia Panel: No results for input(s): VITAMINB12, FOLATE, FERRITIN, TIBC, IRON, RETICCTPCT in the last 72 hours. Sepsis Labs: Recent Labs  Lab 03-09-18 1325 March 09, 2018 1527  LATICACIDVEN 2.47* 3.22*    Recent Results (from the past 240 hour(s))  Blood Culture (routine x 2)     Status: None (Preliminary result)   Collection Time: March 09, 2018  1:11  PM  Result Value Ref Range Status   Specimen Description BLOOD LEFT ARM  Final   Special Requests   Final    BOTTLES DRAWN AEROBIC AND ANAEROBIC Blood Culture adequate volume   Culture   Final    NO GROWTH < 24 HOURS Performed at [facility], [address] [place], [place] [address]    Report Status PENDING  Incomplete  Blood Culture (routine x 2)     Status: None (Preliminary result)   Collection Time: March 09, 2018  1:24 PM  Result Value Ref Range Status   Specimen Description BLOOD LEFT ARM  Final   Special Requests   Final    BOTTLES DRAWN AEROBIC AND ANAEROBIC Blood Culture adequate volume   Culture   Final    NO GROWTH < 24 HOURS Performed at [facility], [address], [place], [place] [address]    Report Status PENDING  Incomplete  MRSA PCR Screening     Status: None   Collection Time: 03-09-2018  6:11 PM  Result Value Ref Range Status   MRSA by PCR NEGATIVE NEGATIVE Final    Comment:        The GeneXpert MRSA Assay (FDA approved for NASAL specimens only), is one component of a comprehensive MRSA colonization surveillance program. It is not intended to diagnose MRSA infection nor to guide or monitor treatment for MRSA infections. Performed at [facility], [address] [place], [place] [address]          Radiology Studies: Ct Head Wo Contrast  Result Date: 02/24/2018 CLINICAL DATA:  Altered mental status, worsening for 5 days. Incontinence. History of dementia, hypertension and atrial fibrillation. EXAM: CT HEAD WITHOUT CONTRAST TECHNIQUE: Contiguous axial images were obtained from the base of the skull through the vertex without intravenous contrast. COMPARISON:  CT HEAD Jul 31, 2017 FINDINGS: Mild motion degraded examination. BRAIN: No intraparenchymal hemorrhage, mass effect nor midline shift. Mild ex vacuo dilatation LEFT frontal horn. No hydrocephalus. Patchy predominately bifrontal supratentorial white matter hypodensities are similar. No acute large vascular  territory infarcts. No abnormal extra-axial fluid collections. Basal cisterns are patent. VASCULAR: Moderate calcific atherosclerosis of the carotid siphons. SKULL: No skull fracture. No significant scalp soft tissue swelling. SINUSES/ORBITS: Trace paranasal sinus mucosal thickening. Mastoid air cells are well aerated.The included ocular globes and orbital contents are non-suspicious. OTHER: None. IMPRESSION: 1. No acute intracranial process on this mildly motion degraded examination. 2. Stable examination including mild asymmetric LEFT frontal volume loss associated with neuro degenerative syndromes. Electronically Signed   By: Awilda Metro M.D.   On: 02/20/2018 20:03   Dg Chest Port 1 View  Result Date: 02/08/2018 CLINICAL DATA:  Pneumonia. EXAM: PORTABLE CHEST 1 VIEW COMPARISON:  Chest x-ray from yesterday. FINDINGS: Unchanged left chest wall pacemaker. Stable cardiomediastinal silhouette status post TAVR. Atherosclerotic calcification of the aortic arch. Diffuse airspace disease within the right mid to lower lung is unchanged. Mild atelectasis at the left lung base. No pleural effusion or pneumothorax. No acute osseous abnormality. IMPRESSION: Unchanged multifocal pneumonia in the right lung. Electronically Signed   By: Obie Dredge M.D.   On: 02/08/2018 09:14   Dg Chest Portable 1 View  Result Date: 02/16/2018 CLINICAL DATA:  Acute shortness of breath. EXAM: PORTABLE CHEST 1 VIEW COMPARISON:  08/05/2017 FINDINGS: Diffuse airspace opacities within the mid and LOWER RIGHT lung noted with small RIGHT pleural effusion. Cardiomediastinal silhouette is unchanged. Aortic valve replacement and LEFT-sided pacemaker again noted. Mild vascular congestion noted. There is no evidence of pneumothorax. IMPRESSION: RIGHT lung airspace opacities, question infection versus asymmetric edema. Small RIGHT pleural effusion. Mild pulmonary vascular congestion. Electronically Signed   By: Harmon Pier M.D.   On:  02/21/2018 13:31        Scheduled Meds: . budesonide (PULMICORT) nebulizer solution  0.25 mg Nebulization BID  . enoxaparin (LOVENOX) injection  50 mg Subcutaneous Q24H  . ipratropium-albuterol  3 mL Nebulization Q6H  . mouth rinse  15 mL Mouth Rinse BID  . metoprolol tartrate  5 mg Intravenous Q6H   Continuous Infusions: . dextrose 50 mL/hr at 02/26/2018 1831  . piperacillin-tazobactam (ZOSYN)  IV       LOS: 1 day    Time spent:    Erick Blinks, MD [facility] Pager [phone]  If 7PM-7AM, please contact night-coverage [email or web address] Password [email or web address] 02/08/2018, 12:07 PM

## 2018-02-08 NOTE — Progress Notes (Signed)
Suctioned large amount of yellow secretions, suspect patient is also fluid positive with her pneumonia. Pulse and hr do not match.

## 2018-02-08 NOTE — Progress Notes (Signed)
Sputum has been obtained thick yellow secretions

## 2018-02-08 NOTE — Progress Notes (Signed)
Initial Nutrition Assessment  DOCUMENTATION CODES:  Underweight, Malnutrition highly suspected, but unable to see in person today to perform physical exam  INTERVENTION:  Monitor for ST recommendations and diet advancement. Will add appropriate supplements at that time  As noted below, depending on if the pt's poor intake is related to acute illness or dementia progression will determine appropriateness of PEG  Will follow up next week to discuss nutrition options with family  NUTRITION DIAGNOSIS:  Inadequate oral intake related to inability to eat as evidenced by NPO status.  GOAL:  Patient will meet greater than or equal to 90% of their needs  MONITOR:  Supplement acceptance, Diet advancement, Weight trends, I & O's, Goals of care  REASON FOR ASSESSMENT:  Consult Assessment of nutrition requirement/status  ASSESSMENT:  77 y/o female PMHx dementia, MDD, HTN, COPD, CHF, Afib. Presented from nursing facility d/t acute decline x 4 days. Normally independent and now nonambulatory, incontinent of urine and not eating/drinking. Work up suggested severe dehydration and PNA. Admitted for management  RD operating remotely on weekends. Per chart, the patients sister reported that pt has actually been eating poorly for the past 2 weeks, as opposed to only last 4 days. Pt apparently has been seen pocketing food in her mouth and not swallowing. Sister also says the pt has refused some medications.   Despite reports of poor intake, pt appears to be maintaining her weight. She has been weighed on bed scale today at ~113 lbs. Looking at patient's PCP encounters via Care Everywhere, pt weighed 114 lbs at her appt 2 months ago, 111.2 lbs in  March, and 105 lbs in December 2018. She appears to be chronically underweight  Pt's weight is stable long term, which would suggest more of an acute change in intake. Potentially pts intake will improve with tx of acute illness. If does not, then poor intake  would likely be more related to dementia progression. PEG would likely not be appropriate in this scenario.   However,  If patient does have improvement in intake with tx of acute illness, aspiration could have potentially been the precipitating factor to this acute decline. In event pt is deemed to have underlying aspiration w/ oral intake, but still is maintaining some semblance of QOL, a PEG may be appropriate.   Agree with ST eval. Will monitor outcome of ST eval and pt's subsequent intake. Can follow up to discuss PEG/nutrition options with family next week.    Labs: BNP:583, WBC:25.0, Lactic Acid:3.2, Albumin:2.3, K:3.4,  BUN/Creat:25/1.1, Sodium:149, Meds: IV abx  Recent Labs  Lab 02/08/2018 1305 02/08/2018 1338 02/08/18 0457  NA 149* 151* 149*  K 3.4* 3.6 3.4*  CL 118* 116* 122*  CO2 21*  --  18*  BUN 26* 29* 25*  CREATININE 1.01* 1.00 1.10*  CALCIUM 8.0*  --  7.8*  MG  --   --  2.0  GLUCOSE 102* 98 107*   NUTRITION - FOCUSED PHYSICAL EXAM: Unable to assess  Diet Order:   Diet Order            Diet NPO time specified Except for: Ice Chips  Diet effective now             EDUCATION NEEDS:  No education needs have been identified at this time  Skin:   Abrasions to BLE, Skin tear to coccyx  Last BM:  Unknown  Height:  Ht Readings from Last 1 Encounters:  02/08/18 5\' 6"  (1.676 m)   Weight:  Wt  Readings from Last 1 Encounters:  02/08/18 51.2 kg   Wt Readings from Last 10 Encounters:  02/08/18 51.2 kg  07/31/17 51.3 kg  12/22/16 43.1 kg  08/08/16 51.7 kg   Ideal Body Weight:  59.1 kg  BMI:  Body mass index is 18.22 kg/m.  Estimated Nutritional Needs:  Kcal:  1550-1700 (30-33 kcal/kg bw) Protein:  72-82g Pro (1.4-1.6g/kg bw) Fluid:  1.3-1.6 L fluid (25-30 ml/kg bw)  Christophe Louis RD, LDN, CNSC Clinical Nutrition Available Tues-Sat via Pager: [phone] 02/08/2018 10:07 AM

## 2018-02-09 LAB — COMPREHENSIVE METABOLIC PANEL
ALT: 52 U/L — ABNORMAL HIGH (ref 0–44)
AST: 37 U/L (ref 15–41)
Albumin: 2 g/dL — ABNORMAL LOW (ref 3.5–5.0)
Alkaline Phosphatase: 104 U/L (ref 38–126)
Anion gap: 10 (ref 5–15)
BUN: 26 mg/dL — ABNORMAL HIGH (ref 8–23)
CO2: 18 mmol/L — ABNORMAL LOW (ref 22–32)
Calcium: 8 mg/dL — ABNORMAL LOW (ref 8.9–10.3)
Chloride: 119 mmol/L — ABNORMAL HIGH (ref 98–111)
Creatinine, Ser: 1.16 mg/dL — ABNORMAL HIGH (ref 0.44–1.00)
GFR calc Af Amer: 53 mL/min — ABNORMAL LOW (ref >60)
GFR calc non Af Amer: 45 mL/min — ABNORMAL LOW (ref >60)
Glucose, Bld: 209 mg/dL — ABNORMAL HIGH (ref 70–99)
POTASSIUM: 3.5 mmol/L (ref 3.5–5.1)
Sodium: 147 mmol/L — ABNORMAL HIGH (ref 135–145)
Total Bilirubin: 0.4 mg/dL (ref 0.3–1.2)
Total Protein: 5.8 g/dL — ABNORMAL LOW (ref 6.5–8.1)

## 2018-02-09 LAB — STREP PNEUMONIAE URINARY ANTIGEN: Strep Pneumo Urinary Antigen: POSITIVE — AB

## 2018-02-09 LAB — CBC WITH DIFFERENTIAL/PLATELET
ABS IMMATURE GRANULOCYTES: 0.3 10*3/uL — AB (ref 0.00–0.07)
Basophils Absolute: 0 10*3/uL (ref 0.0–0.1)
Basophils Relative: 0 %
Eosinophils Absolute: 0 10*3/uL (ref 0.0–0.5)
Eosinophils Relative: 0 %
HCT: 29.5 % — ABNORMAL LOW (ref 36.0–46.0)
Hemoglobin: 8.3 g/dL — ABNORMAL LOW (ref 12.0–15.0)
Immature Granulocytes: 1 %
Lymphocytes Relative: 2 %
Lymphs Abs: 0.4 10*3/uL — ABNORMAL LOW (ref 0.7–4.0)
MCH: 23.6 pg — ABNORMAL LOW (ref 26.0–34.0)
MCHC: 28.1 g/dL — ABNORMAL LOW (ref 30.0–36.0)
MCV: 83.8 fL (ref 80.0–100.0)
MONO ABS: 0.2 10*3/uL (ref 0.1–1.0)
MONOS PCT: 1 %
NEUTROS ABS: 22.6 10*3/uL — AB (ref 1.7–7.7)
Neutrophils Relative %: 96 %
Platelets: 433 10*3/uL — ABNORMAL HIGH (ref 150–400)
RBC: 3.52 MIL/uL — ABNORMAL LOW (ref 3.87–5.11)
RDW: 19.5 % — ABNORMAL HIGH (ref 11.5–15.5)
WBC: 23.5 10*3/uL — ABNORMAL HIGH (ref 4.0–10.5)
nRBC: 0.3 % — ABNORMAL HIGH (ref 0.0–0.2)

## 2018-02-09 LAB — BRAIN NATRIURETIC PEPTIDE: B Natriuretic Peptide: 376 pg/mL — ABNORMAL HIGH (ref 0.0–100.0)

## 2018-02-09 LAB — HIV ANTIBODY (ROUTINE TESTING W REFLEX): HIV Screen 4th Generation wRfx: NONREACTIVE

## 2018-02-09 MED ORDER — SODIUM CHLORIDE 0.9 % IV BOLUS
500.0000 mL | Freq: Once | INTRAVENOUS | Status: AC
  Administered 2018-02-09: 500 mL via INTRAVENOUS

## 2018-02-09 MED ORDER — ALBUTEROL SULFATE (2.5 MG/3ML) 0.083% IN NEBU
INHALATION_SOLUTION | RESPIRATORY_TRACT | Status: AC
  Administered 2018-02-09: 2.5 mg
  Filled 2018-02-09: qty 3

## 2018-02-09 MED ORDER — ALBUTEROL SULFATE (2.5 MG/3ML) 0.083% IN NEBU: 2.5000 mg | INHALATION_SOLUTION | RESPIRATORY_TRACT | Status: DC | PRN

## 2018-02-09 NOTE — Progress Notes (Signed)
Dr. Kerry Hough notified of hypotension and Cardizem drip being discontinued.  Orders given to start NS bolus.

## 2018-02-09 NOTE — Progress Notes (Signed)
Patient ntsx obtained thick green plug, Along with yellow thin white secretions. Almost sure pneumonia is from aspiration.

## 2018-02-09 NOTE — Progress Notes (Signed)
Off BiPAP for now, has some break down on nose.from mask

## 2018-02-09 NOTE — Progress Notes (Addendum)
PROGRESS NOTE    Karen Larson  PHK:[id number] DOB: June 23, 1940 DOA: 03/01/2018 PCP: Roger Kill, PA-C    Brief Narrative:  77 y/o female with a history of advanced dementia who is a resident of a nursing facility, is brought to the hospital with general decline for approximately 4 days prior to admission.  She was admitted to the hospital with sepsis, dehydration and pneumonia.  She was started on intravenous fluids and antibiotics.  The patient has been refusing to eat and drink prior to admission.  This is likely progression of her dementia.  Prognosis is guarded.   Assessment & Plan:   Principal Problem:   Severe sepsis (HCC) Active Problems:   Cardiomyopathy (HCC)   Chronic obstructive pulmonary disease (HCC)   Congestive heart failure (HCC)   Protein-calorie malnutrition, severe   S/P TAVR (transcatheter aortic valve replacement)   Hypernatremia   Severe dehydration   Leukocytosis   Elevated lactic acid level   HCAP (healthcare-associated pneumonia)   1. Sepsis secondary to healthcare associated pneumonia.  Continued on intravenous antibiotics, IV fluids.  Blood cultures have shown no growth.  Respiratory viral panel is negative.  Urine culture shows unidentified organism.  Continue current treatments.   2. Pneumonia, healthcare associated pneumonia versus aspiration pneumonia.  MRSA PCR is negative, discontinued vancomycin.  Currently on Zosyn 3. COPD.  No evidence of wheezing at this time.  Continue on bronchodilators and inhaled steroids.. 4. Hypernatremia.  Related to decreased p.o. intake and severe dehydration.  Improving with hypotonic fluids. 5. Severe protein calorie malnutrition.  Related to decreased p.o. intake in the setting of advanced dementia.  I have advised against PEG tube placement, family is considering it. 6. Chronic atrial fibrillation.  Patient had rapid atrial fibrillation yesterday requiring Cardizem infusion.  She was also on intravenous  metoprolol.  She developed significant hypotension this morning.  Cardizem has since been discontinued as has metoprolol.  Blood pressure has since improved and heart rate remained stable.  Continue to monitor.  She is currently on full dose Lovenox. 7. Acute metabolic encephalopathy, superimposed on baseline dementia.  CT head does not show any acute findings.  The patient does have progressive dementia and has been refusing to eat or drink prior to admission.  Mental status is mildly better today, but she is still lethargic. 8. Chronic combined CHF.  Ejection fraction 45 to 50% with grade 1 diastolic dysfunction.  Continue to monitor volume status in setting of hydration.   DVT prophylaxis: lovenox Code Status: full code Family Communication: discussed with sister mary over the phone Disposition Plan: pending hospital course, possible discharge back to SNF once respiratory status has stabilized vs. Hospice if continues to decline   Consultants:     Procedures:     Antimicrobials:   Cefepime 12/6>12/7  Vancomycin 12/6>12/7  Zosyn 12/7>   Subjective: Patient remains lethargic.  Does not answer questions or follow commands.  Objective: Vitals:   02/09/18 1000 02/09/18 1100 02/09/18 1129 02/09/18 1200  BP: (!) 100/54 (!) 93/58  96/61  Pulse:    73  Resp: (!) 24 (!) 23  (!) 23  Temp:   97.6 F (36.4 C)   TempSrc:   Axillary   SpO2:    99%  Weight:      Height:        Intake/Output Summary (Last 24 hours) at 02/09/2018 1431 Last data filed at 02/09/2018 0600 Gross per 24 hour  Intake 1527.82 ml  Output 75 ml  Net 1452.82 ml   Filed Weights   02/08/18 0600 02/08/18 0959 02/09/18 0340  Weight: 51.2 kg 51.2 kg 52.5 kg    Examination:  General exam: Somnolent, laying in bed Respiratory system: Bilateral crackles.  Mild increased respiratory effort Cardiovascular system:RRR. No murmurs, rubs, gallops. Gastrointestinal system: Abdomen is nondistended, soft and  nontender. No organomegaly or masses felt. Normal bowel sounds heard. Central nervous system: No focal neurological deficits. Extremities: No C/C/E, +pedal pulses Skin: No rashes, lesions or ulcers Psychiatry: Somnolent.    Data Reviewed: I have personally reviewed following labs and imaging studies  CBC: Recent Labs  Lab 02/21/2018 1305 02/06/2018 1338 02/08/18 0457 02/09/18 0452  WBC 25.2*  --  25.0* 23.5*  NEUTROABS 22.4*  --  22.6* 22.6*  HGB 10.2* 10.9* 9.2* 8.3*  HCT 34.8* 32.0* 33.1* 29.5*  MCV 80.9  --  83.4 83.8  PLT 554*  --  454* 433*   Basic Metabolic Panel: Recent Labs  Lab 02/06/2018 1305 02/10/2018 1338 02/08/18 0457 02/09/18 0452  NA 149* 151* 149* 147*  K 3.4* 3.6 3.4* 3.5  CL 118* 116* 122* 119*  CO2 21*  --  18* 18*  GLUCOSE 102* 98 107* 209*  BUN 26* 29* 25* 26*  CREATININE 1.01* 1.00 1.10* 1.16*  CALCIUM 8.0*  --  7.8* 8.0*  MG  --   --  2.0  --    GFR: Estimated Creatinine Clearance: 33.7 mL/min (A) (by C-G formula based on SCr of 1.16 mg/dL (H)). Liver Function Tests: Recent Labs  Lab 02/26/2018 1305 02/08/18 0457 02/09/18 0452  AST 43* 75* 37  ALT 44 64* 52*  ALKPHOS 121 114 104  BILITOT 0.5 0.5 0.4  PROT 6.5 6.0* 5.8*  ALBUMIN 2.4* 2.3* 2.0*   No results for input(s): LIPASE, AMYLASE in the last 168 hours. No results for input(s): AMMONIA in the last 168 hours. Coagulation Profile: No results for input(s): INR, PROTIME in the last 168 hours. Cardiac Enzymes: Recent Labs  Lab 02/16/2018 1305  TROPONINI <0.03   BNP (last 3 results) No results for input(s): PROBNP in the last 8760 hours. HbA1C: No results for input(s): HGBA1C in the last 72 hours. CBG: Recent Labs  Lab 02/08/18 0743 02/08/18 1211 02/08/18 1615  GLUCAP 116* 78 70   Lipid Profile: No results for input(s): CHOL, HDL, LDLCALC, TRIG, CHOLHDL, LDLDIRECT in the last 72 hours. Thyroid Function Tests: No results for input(s): TSH, T4TOTAL, FREET4, T3FREE, THYROIDAB in  the last 72 hours. Anemia Panel: No results for input(s): VITAMINB12, FOLATE, FERRITIN, TIBC, IRON, RETICCTPCT in the last 72 hours. Sepsis Labs: Recent Labs  Lab 02/22/2018 1325 02/13/2018 1527  LATICACIDVEN 2.47* 3.22*    Recent Results (from the past 240 hour(s))  Blood Culture (routine x 2)     Status: None (Preliminary result)   Collection Time: 03/02/2018  1:11 PM  Result Value Ref Range Status   Specimen Description BLOOD LEFT ARM  Final   Special Requests   Final    BOTTLES DRAWN AEROBIC AND ANAEROBIC Blood Culture adequate volume   Culture   Final    NO GROWTH 2 DAYS Performed at [facility], [address] [place], [place] [address]    Report Status PENDING  Incomplete  Blood Culture (routine x 2)     Status: None (Preliminary result)   Collection Time: 02/04/2018  1:24 PM  Result Value Ref Range Status   Specimen Description BLOOD LEFT ARM  Final   Special Requests  Final    BOTTLES DRAWN AEROBIC AND ANAEROBIC Blood Culture adequate volume   Culture   Final    NO GROWTH 2 DAYS Performed at [facility], [address] [place], [place] [address]    Report Status PENDING  Incomplete  Urine culture     Status: Abnormal (Preliminary result)   Collection Time: 02/17/2018  1:30 PM  Result Value Ref Range Status   Specimen Description   Final    URINE, CLEAN CATCH Performed at [facility], [address], [place], [place] [address]    Special Requests   Final    NONE Performed at [facility], [address], [place], [place] [address]    Culture (A)  Final    >=100,000 COLONIES/mL UNIDENTIFIED ORGANISM Performed at [facility], [address], [place], [place] [address]    Report Status PENDING  Incomplete  Respiratory Panel by PCR     Status: None   Collection Time: 02/26/2018  6:11 PM  Result Value Ref Range Status   Adenovirus NOT DETECTED NOT DETECTED Final   Coronavirus 229E NOT DETECTED NOT DETECTED Final   Coronavirus HKU1 NOT DETECTED NOT DETECTED Final    Coronavirus NL63 NOT DETECTED NOT DETECTED Final   Coronavirus OC43 NOT DETECTED NOT DETECTED Final   Metapneumovirus NOT DETECTED NOT DETECTED Final   Rhinovirus / Enterovirus NOT DETECTED NOT DETECTED Final   Influenza A NOT DETECTED NOT DETECTED Final   Influenza B NOT DETECTED NOT DETECTED Final   Parainfluenza Virus 1 NOT DETECTED NOT DETECTED Final   Parainfluenza Virus 2 NOT DETECTED NOT DETECTED Final   Parainfluenza Virus 3 NOT DETECTED NOT DETECTED Final   Parainfluenza Virus 4 NOT DETECTED NOT DETECTED Final   Respiratory Syncytial Virus NOT DETECTED NOT DETECTED Final   Bordetella pertussis NOT DETECTED NOT DETECTED Final   Chlamydophila pneumoniae NOT DETECTED NOT DETECTED Final   Mycoplasma pneumoniae NOT DETECTED NOT DETECTED Final    Comment: Performed at [facility], [address], [place], [place] [address]  MRSA PCR Screening     Status: None   Collection Time: 02/15/2018  6:11 PM  Result Value Ref Range Status   MRSA by PCR NEGATIVE NEGATIVE Final    Comment:        The GeneXpert MRSA Assay (FDA approved for NASAL specimens only), is one component of a comprehensive MRSA colonization surveillance program. It is not intended to diagnose MRSA infection nor to guide or monitor treatment for MRSA infections. Performed at [facility], [address], [place], [place] [address]   Culture, respiratory     Status: None (Preliminary result)   Collection Time: 02/08/18  2:04 AM  Result Value Ref Range Status   Specimen Description   Final    SPUTUM Performed at [facility], [address], [place], [place] [address]    Special Requests   Final    NONE Performed at [facility], [address] [place], [place] [address]    Gram Stain   Final    ABUNDANT WBC PRESENT, PREDOMINANTLY PMN RARE GRAM POSITIVE RODS Performed at [facility], [address], [place], [place] [address]    Culture RARE STAPHYLOCOCCUS AUREUS  Final   Report Status PENDING   Incomplete         Radiology Studies: Ct Head Wo Contrast  Result Date: 02/13/2018 CLINICAL DATA:  Altered mental status, worsening for 5 days. Incontinence. History of dementia, hypertension and atrial fibrillation.  EXAM: CT HEAD WITHOUT CONTRAST TECHNIQUE: Contiguous axial images were obtained from the base of the skull through the vertex without intravenous contrast. COMPARISON:  CT HEAD Jul 31, 2017 FINDINGS: Mild motion degraded examination. BRAIN: No intraparenchymal hemorrhage, mass effect nor midline shift. Mild ex vacuo dilatation LEFT frontal horn. No hydrocephalus. Patchy predominately bifrontal supratentorial white matter hypodensities are similar. No acute large vascular territory infarcts. No abnormal extra-axial fluid collections. Basal cisterns are patent. VASCULAR: Moderate calcific atherosclerosis of the carotid siphons. SKULL: No skull fracture. No significant scalp soft tissue swelling. SINUSES/ORBITS: Trace paranasal sinus mucosal thickening. Mastoid air cells are well aerated.The included ocular globes and orbital contents are non-suspicious. OTHER: None. IMPRESSION: 1. No acute intracranial process on this mildly motion degraded examination. 2. Stable examination including mild asymmetric LEFT frontal volume loss associated with neuro degenerative syndromes. Electronically Signed   By: Awilda Metro M.D.   On: 02/10/2018 20:03   Dg Chest Port 1 View  Result Date: 02/08/2018 CLINICAL DATA:  Pneumonia. EXAM: PORTABLE CHEST 1 VIEW COMPARISON:  Chest x-ray from yesterday. FINDINGS: Unchanged left chest wall pacemaker. Stable cardiomediastinal silhouette status post TAVR. Atherosclerotic calcification of the aortic arch. Diffuse airspace disease within the right mid to lower lung is unchanged. Mild atelectasis at the left lung base. No pleural effusion or pneumothorax. No acute osseous abnormality. IMPRESSION: Unchanged multifocal pneumonia in the right lung. Electronically Signed    By: Obie Dredge M.D.   On: 02/08/2018 09:14        Scheduled Meds: . budesonide (PULMICORT) nebulizer solution  0.25 mg Nebulization BID  . enoxaparin (LOVENOX) injection  50 mg Subcutaneous Q24H  . ipratropium-albuterol  3 mL Nebulization Q6H  . mouth rinse  15 mL Mouth Rinse BID  . methylPREDNISolone (SOLU-MEDROL) injection  60 mg Intravenous Q12H   Continuous Infusions: . dextrose 5 % and 0.45 % NaCl with KCl 20 mEq/L 125 mL/hr at 02/09/18 0955  . diltiazem (CARDIZEM) infusion Stopped (02/09/18 7829)  . piperacillin-tazobactam (ZOSYN)  IV 12.5 mL/hr at 02/09/18 0600     LOS: 2 days    Time spent:    Erick Blinks, MD [facility] Pager [phone]  If 7PM-7AM, please contact night-coverage [email or web address] Password [facility] 02/09/2018, 2:31 PM

## 2018-02-09 NOTE — Progress Notes (Signed)
Patient still lethargic, does pull at mask occasionally. Saturation is 100 percent have decreased oxygen to 45, respirations 24 consistent. Pulse ox probe questionable still. Hx of dementia , unsure how bad?? Suspect this is pneumonia from aspiration most likely related to her dementia. Still positive fluids , No flu. Will continue to follow.

## 2018-02-09 NOTE — Progress Notes (Signed)
Dr. Julian Reil notified of Cardizem drip d/c due to hypotension. B/P 78/50

## 2018-02-10 DIAGNOSIS — Z7189 Other specified counseling: Secondary | ICD-10-CM

## 2018-02-10 DIAGNOSIS — Z9189 Other specified personal risk factors, not elsewhere classified: Secondary | ICD-10-CM

## 2018-02-10 DIAGNOSIS — F0390 Unspecified dementia without behavioral disturbance: Secondary | ICD-10-CM

## 2018-02-10 DIAGNOSIS — Z515 Encounter for palliative care: Secondary | ICD-10-CM

## 2018-02-10 LAB — COMPREHENSIVE METABOLIC PANEL
ALT: 41 U/L (ref 0–44)
ANION GAP: 5 (ref 5–15)
AST: 26 U/L (ref 15–41)
Albumin: 1.9 g/dL — ABNORMAL LOW (ref 3.5–5.0)
Alkaline Phosphatase: 88 U/L (ref 38–126)
BUN: 24 mg/dL — ABNORMAL HIGH (ref 8–23)
CO2: 18 mmol/L — ABNORMAL LOW (ref 22–32)
Calcium: 8 mg/dL — ABNORMAL LOW (ref 8.9–10.3)
Chloride: 124 mmol/L — ABNORMAL HIGH (ref 98–111)
Creatinine, Ser: 1.11 mg/dL — ABNORMAL HIGH (ref 0.44–1.00)
GFR calc Af Amer: 55 mL/min — ABNORMAL LOW (ref >60)
GFR calc non Af Amer: 48 mL/min — ABNORMAL LOW (ref >60)
Glucose, Bld: 205 mg/dL — ABNORMAL HIGH (ref 70–99)
POTASSIUM: 3.5 mmol/L (ref 3.5–5.1)
Sodium: 147 mmol/L — ABNORMAL HIGH (ref 135–145)
Total Bilirubin: 0.6 mg/dL (ref 0.3–1.2)
Total Protein: 5.3 g/dL — ABNORMAL LOW (ref 6.5–8.1)

## 2018-02-10 LAB — CBC WITH DIFFERENTIAL/PLATELET
Abs Immature Granulocytes: 0.3 10*3/uL — ABNORMAL HIGH (ref 0.00–0.07)
BASOS PCT: 0 %
Basophils Absolute: 0 10*3/uL (ref 0.0–0.1)
Eosinophils Absolute: 0 10*3/uL (ref 0.0–0.5)
Eosinophils Relative: 0 %
HEMATOCRIT: 25.8 % — AB (ref 36.0–46.0)
Hemoglobin: 7.2 g/dL — ABNORMAL LOW (ref 12.0–15.0)
Immature Granulocytes: 1 %
Lymphocytes Relative: 1 %
Lymphs Abs: 0.3 10*3/uL — ABNORMAL LOW (ref 0.7–4.0)
MCH: 23.7 pg — ABNORMAL LOW (ref 26.0–34.0)
MCHC: 27.9 g/dL — ABNORMAL LOW (ref 30.0–36.0)
MCV: 84.9 fL (ref 80.0–100.0)
Monocytes Absolute: 0.4 10*3/uL (ref 0.1–1.0)
Monocytes Relative: 2 %
NEUTROS ABS: 24.4 10*3/uL — AB (ref 1.7–7.7)
NRBC: 0.7 % — AB (ref 0.0–0.2)
Neutrophils Relative %: 96 %
Platelets: 354 10*3/uL (ref 150–400)
RBC: 3.04 MIL/uL — ABNORMAL LOW (ref 3.87–5.11)
RDW: 19.9 % — ABNORMAL HIGH (ref 11.5–15.5)
WBC: 25.4 10*3/uL — ABNORMAL HIGH (ref 4.0–10.5)

## 2018-02-10 LAB — CULTURE, RESPIRATORY W GRAM STAIN

## 2018-02-10 MED ORDER — SODIUM CHLORIDE 0.45 % IV BOLUS
1000.0000 mL | Freq: Once | INTRAVENOUS | Status: AC
  Administered 2018-02-10: 1000 mL via INTRAVENOUS

## 2018-02-10 MED ORDER — METOPROLOL TARTRATE 5 MG/5ML IV SOLN
5.0000 mg | Freq: Four times a day (QID) | INTRAVENOUS | Status: DC
  Administered 2018-02-10 – 2018-02-11 (×4): 5 mg via INTRAVENOUS
  Filled 2018-02-10 (×3): qty 5

## 2018-02-10 NOTE — Evaluation (Signed)
Clinical/Bedside Swallow Evaluation Patient Details  Name: JAHZEL SCANLON MRN: [id number] Date of Birth: Apr 06, 1940  Today's Date: 02/10/2018 Time: SLP Start Time (ACUTE ONLY): 2585 SLP Stop Time (ACUTE ONLY): 0954 SLP Time Calculation (min) (ACUTE ONLY): 29 min  Past Medical History:  Past Medical History:  Diagnosis Date  . Anemia   . Aortic valve stenosis   . Arthritis   . Atrial fibrillation (HCC)   . Cardiomyopathy (HCC)   . CHF (congestive heart failure) (HCC)   . COPD (chronic obstructive pulmonary disease) (HCC)   . Dementia (HCC)   . Falls   . Hypertension   . Hypothyroidism   . MDD (major depressive disorder)   . Nicotine dependence   . Thyroid disease    Past Surgical History:  Past Surgical History:  Procedure Laterality Date  . ABDOMINAL HYSTERECTOMY    . APPENDECTOMY    . CARDIAC SURGERY     valve replacement  . PACEMAKER INSERTION     HPI:  MELVINIA WILLERS is a 77 y.o. female with dementia and resident of [facility] skilled nursing facility was sent in from the nursing home because they have reported that she has had a decline for the past 4 days where she normally had been independent and now has become nonambulatory not eating or drinking and incontinent of urine.  There have been no fevers reported.  No changes in medications reported.  Of note, the patient's sister reports that her cardiologist had notified her that they picked up atrial fibrillation on her planted monitor and they wanted her to come in to discuss anticoagulation.  Unfortunately the patient has ended up in the hospital before she has been able to see her cardiologist.   Assessment / Plan / Recommendation Clinical Impression  Pt was sitting upright in bed with her eyes closed and an audible "gurgle". Oral mech reveals severe xerostomia and excessive dried secretions that are black in color. SLP provided oral care via toothettes and mouth moisture; with increased attempts to cleanse oral  cavity note bloody secretions from inside of oral cavity and lingual surface. SLP was unable to clear dark dried secretions. Despite max attempts SLP was unable to rouse Pt to appropriate alertness for PO. SLP communicated with palliative NP, MD and RN to provide recommendation for NPO. Acknowledge Palliative referral and ST will continue to follow for goals of care. SLP Visit Diagnosis: Dysphagia, unspecified (R13.10)    Aspiration Risk  Severe aspiration risk    Diet Recommendation NPO        Other  Recommendations Oral Care Recommendations: Oral care BID   Follow up Recommendations   Continue to Follow for Goals of care     Frequency and Duration min 1 x/week  1 week       Prognosis Prognosis for Safe Diet Advancement: Guarded Barriers to Reach Goals: Severity of deficits      Swallow Study   General Date of Onset: 02-08-18 HPI: JAVEYAH DRAINE is a 77 y.o. female with dementia and resident of [facility] skilled nursing facility was sent in from the nursing home because they have reported that she has had a decline for the past 4 days where she normally had been independent and now has become nonambulatory not eating or drinking and incontinent of urine.  There have been no fevers reported.  No changes in medications reported.  Of note, the patient's sister reports that her cardiologist had notified her that they picked up atrial fibrillation  on her planted monitor and they wanted her to come in to discuss anticoagulation.  Unfortunately the patient has ended up in the hospital before she has been able to see her cardiologist. Type of Study: Bedside Swallow Evaluation Previous Swallow Assessment: none in chart Diet Prior to this Study: NPO Temperature Spikes Noted: No Respiratory Status: Room air History of Recent Intubation: No Behavior/Cognition: Lethargic/Drowsy Oral Cavity Assessment: Dry;Dried secretions;Excessive secretions Oral Care Completed by SLP: Yes Oral Cavity -  Dentition: Missing dentition;Poor condition Vision: Impaired for self-feeding Volitional Cough: Cognitively unable to elicit    Oral/Motor/Sensory Function Overall Oral Motor/Sensory Function: Within functional limits   Ice Chips Ice chips: Not tested   Thin Liquid Thin Liquid: Not tested    Nectar Thick Nectar Thick Liquid: Not tested   Honey Thick Honey Thick Liquid: Not tested   Puree Puree: Not tested   Solid     Solid: Not tested     Amelia H. Romie Levee, CCC-SLP Speech Language Pathologist  Georgetta Haber 02/10/2018,3:47 PM

## 2018-02-10 NOTE — Consult Note (Signed)
Consultation Note Date: 02/10/2018   Patient Name: Karen Larson  DOB: 01-Mar-1941  MRN: [id number]  Age / Sex: 77 y.o., female  PCP: Heywood Bene, PA-C Referring Physician: Kathie Dike, MD  Reason for Consultation: Establishing goals of care  HPI/Patient Profile: 77 y.o. female  with past medical history of dementia, hypothyroidism, depression, HTN, falls, COPD, CHF, cardiomyopathy, afib, arthritis, aortic stenosis, pacemaker placement admitted on 02/04/2018 with confusion and general decline x4 days. Admitted with sepsis, dehydration, pneumonia. Started on IVF and antibiotics. Also with afib RVR, cardizem gtt discontinued due to hypotension. CT head negative for acute findings. Clinically has not shown improvement and remains NPO with severe aspiration risk. Palliative medicine consultation for goals of care.   Clinical Assessment and Goals of Care:  I have reviewed medical records, discussed with care team, and met with sister Karen Larson) in family waiting room to discuss diagnosis prognosis, Karen Larson, EOL wishes, disposition and options.  Patient is lethargic. She will open eyes to voice but not following commands or answering questions. She does not show signs of pain or discomfort.   Introduced Palliative Medicine as specialized medical care for people living with serious illness. It focuses on providing relief from the symptoms and stress of a serious illness. The goal is to improve quality of life for both the patient and the family.  We discussed a brief life review of the patient. Karen Larson tells me Karen Larson has been married 6 times! She has one daughter who she gave up for adoption when she was a baby. Karen Larson describes Karen Larson as "active" and "hyper." She worked until she was 28. She has always been "on the go."   Karen Larson shares that Karen Larson is estranged from current husband. She is unsure if paperwork has gone  through for their divorce. Karen Larson explains that last October, Karen Larson required valve replacement and rehab. After hospitalization, she was moved out of current living conditions (with husband) and Karen Larson along with sisters moved Karen Larson to Karen Larson in a one room apartment. Cognitively, she has had ongoing decline and recurrent falls. Eventually this led Karen Larson and her sisters to move Karen Larson to a nursing facility.   Karen Larson shares that she believes Karen Larson has been depressed for 1+ years and was on Lexapro. She also becomes intermittently agitated and irritable with sisters and nursing staff at SNF. We discussed her declining quality of life.   Discussed events leading up to hospitalization and hospital diagnoses and interventions. Discussed disease trajectory of dementia and EOL expectations. Karen Larson is surprised at how quickly her sister has declined. We discussed subtle signs of ongoing dementia in the last year including recent difficulty swallowing and refusal to eat/drink (pocketing foods).   Advanced directives, concepts specific to code status, artifical feeding and hydration, and rehospitalization were considered and discussed. The patient does NOT have a documented living will or HCPOA. Karen Larson shares that her and two sisters Karen Larson and Karen Larson) have stepped up since Karen Larson's health declined. Family of 15 children altogether. Karen Larson attempted to contact estranged husband last hospital  admission in June and he did not want anything to do with Karen Larson's care due to his own health concerns.   Karen Larson confirms her and her sisters decision for DNR/DNI and NO feeding tube, understanding irreversible nature of patient's dementia. We further discussed low HgB level and possible need for blood transfusion. Karen Larson declines blood transfusion and speaks of this prolonging the inevitable. Karen Larson further speaks of Karen Larson recommendation for hospice if her sister did not show improvement. Discussed my concern that she has not shown  improvement despite aggressive IVF and antibiotics. Also inability to work with SLP or PT.   The difference between aggressive medical intervention and comfort care was considered in light of the patient's goals of care.   Introduced hospice philosophy and options. Explained goal of comfort, peace, and dignity at EOL and allowing nature to take course. Explained utilization of medications to ensure comfort and relief from suffering. Karen Larson wishes to further discuss hospice services with her sister. She remains hopeful that if we continue current interventions for another day or two, Karen Larson may start to show improvement.   Questions and concerns were addressed.  Hard Choices and Gone from my Sight booklet left for review. PMT contact information given. Emotional support provided.    SUMMARY OF RECOMMENDATIONS    GOC discussion with patient's sister Karen Larson). Patient has an estranged husband. No documented HCPOA. Patients' three sisters Karen Larson, Karen Larson, and Karen Larson) have been supportive and active in her care since her separation with husband.   Sisters have made joint decision for DNR/DNI and NO feeding tube, understand irreversible nature of dementia and progression of dementia.   Karen Larson requests more time to discuss hospice services with her sisters. Leaning towards comfort route but remains with slight hope that if we give Karen Larson another 24-48 hours with current plan of care, that she may show improvement.   PMT will follow. Discussed with Dr. Roderic Palau and RN.   Code Status/Advance Care Planning:  DNR  Symptom Management:   Per attending  Palliative Prophylaxis:   Aspiration, Delirium Protocol, Oral Care and Turn Reposition  Additional Recommendations (Limitations, Scope, Preferences):  DNR/DNI. NO feeding tube. Continue current medical management.   Psycho-social/Spiritual:   Desire for further Chaplaincy support: yes  Additional Recommendations: Caregiving  Support/Resources,  Compassionate Wean Education and Education on Hospice  Prognosis:   Poor prognosis with sepsis secondary to HCAP, severe protein calorie malnutrition, acute metabolic encephalopathy, chronic atrial fibrillation, and underlying progressive dementia with severe decline in functional/cognitive/nutritional status.   Discharge Planning: To Be Determined      Primary Diagnoses: Present on Admission: . Severe sepsis ([place]) . Hypernatremia . Severe dehydration . Leukocytosis . Protein-calorie malnutrition, severe . Chronic obstructive pulmonary disease ([place]) . Elevated lactic acid level . HCAP (healthcare-associated pneumonia)   I have reviewed the medical record, interviewed the patient and family, and examined the patient. The following aspects are pertinent.  Past Medical History:  Diagnosis Date  . Anemia   . Aortic valve stenosis   . Arthritis   . Atrial fibrillation ([place])   . Cardiomyopathy ([place])   . CHF (congestive heart failure) ([place])   . COPD (chronic obstructive pulmonary disease) ([place])   . Dementia ([place])   . Falls   . Hypertension   . Hypothyroidism   . MDD (major depressive disorder)   . Nicotine dependence   . Thyroid disease    Social History   Socioeconomic History  . Marital status: Legally Separated    Spouse name:  Not on file  . Number of children: Not on file  . Years of education: Not on file  . Highest education level: Not on file  Occupational History  . Not on file  Social Needs  . Financial resource strain: Not on file  . Food insecurity:    Worry: Not on file    Inability: Not on file  . Transportation needs:    Medical: Not on file    Non-medical: Not on file  Tobacco Use  . Smoking status: Former Research scientist (life sciences)  . Smokeless tobacco: Never Used  Substance and Sexual Activity  . Alcohol use: Not Currently  . Drug use: No  . Sexual activity: Never  Lifestyle  . Physical activity:    Days per week: Not on file    Minutes per session: Not on  file  . Stress: Not on file  Relationships  . Social connections:    Talks on phone: Not on file    Gets together: Not on file    Attends religious service: Not on file    Active member of club or organization: Not on file    Attends meetings of clubs or organizations: Not on file    Relationship status: Not on file  Other Topics Concern  . Not on file  Social History Narrative  . Not on file   History reviewed. No pertinent family history. Scheduled Meds: . budesonide (PULMICORT) nebulizer solution  0.25 mg Nebulization BID  . ipratropium-albuterol  3 mL Nebulization Q6H  . mouth rinse  15 mL Mouth Rinse BID  . methylPREDNISolone (SOLU-MEDROL) injection  60 mg Intravenous Q12H  . metoprolol tartrate  5 mg Intravenous Q6H   Continuous Infusions: . dextrose 5 % and 0.45 % NaCl with KCl 20 mEq/L 125 mL/hr at 02/10/18 0834  . diltiazem (CARDIZEM) infusion Stopped (02/09/18 7867)  . piperacillin-tazobactam (ZOSYN)  IV 3.375 g (02/10/18 1351)  . sodium chloride 1,000 mL (02/10/18 1601)   PRN Meds:.albuterol, LORazepam Medications Prior to Admission:  Prior to Admission medications   Medication Sig Start Date End Date Taking? Authorizing Provider  aspirin 81 MG chewable tablet Chew 81 mg by mouth every other day.   Yes [provider]  Cholecalciferol (VITAMIN D3) 50 MCG (2000 UT) capsule Take 2,000 Units by mouth daily.   Yes [provider]  diphenhydrAMINE (BENADRYL) 25 MG tablet Take 25 mg by mouth every 8 (eight) hours as needed.   Yes [provider]  escitalopram (LEXAPRO) 20 MG tablet Take 10 mg by mouth daily.    Yes [provider]  Fluticasone-Salmeterol (WIXELA INHUB) 250-50 MCG/DOSE AEPB Inhale 1 puff into the lungs 2 (two) times daily.   Yes [provider]  ibuprofen (ADVIL,MOTRIN) 800 MG tablet Take 800 mg by mouth daily as needed.  05/25/16  Yes [provider]  levothyroxine (SYNTHROID, LEVOTHROID) 25 MCG tablet   02/04/18  Yes [provider]  pramoxine (SARNA SENSITIVE) 1 % LOTN Apply 1 application topically 3 (three) times daily.   Yes [provider]  nystatin (MYCOSTATIN) 100000 UNIT/ML suspension Take 5 mLs by mouth 4 (four) times daily.  02/04/18   [provider]   Allergies  Allergen Reactions  . Tiotropium Bromide Monohydrate Nausea Only  . Doxycycline Nausea Only  . Levaquin  [Levofloxacin] Nausea Only   Review of Systems  Unable to perform ROS: Dementia   Physical Exam  Constitutional: She appears lethargic. She appears ill.  HENT:  Head: Normocephalic and atraumatic.  Cardiovascular: An irregularly irregular rhythm present.  Pulmonary/Chest: No accessory muscle usage. No tachypnea. No respiratory distress. She has rhonchi.  Audible secretions  Abdominal: There is no tenderness.  Neurological: She appears lethargic.  Opens eyes to voice occasionally. Does not answer questions or follow commands.   Skin: Skin is warm and dry. Abrasion noted. There is pallor.  Psychiatric: Cognition and memory are impaired. She is noncommunicative. She is inattentive.  Nursing note and vitals reviewed.   Vital Signs: BP 113/69 (BP Location: Right Arm)   Pulse 98   Temp 97.6 F (36.4 C) (Axillary)   Resp (!) 22   Ht _0  (1.676 m) Comment: Taken from 10/16 office visit  Wt 56.5 kg   SpO2 98%   BMI 20.10 kg/m  Pain Scale: PAINAD   Pain Score: 0-No pain   SpO2: SpO2: 98 % O2 Device:SpO2: 98 % O2 Flow Rate: .O2 Flow Rate (L/min): 4 L/min  IO: Intake/output summary:   Intake/Output Summary (Last 24 hours) at 02/10/2018 1631 Last data filed at 02/09/2018 1749 Gross per 24 hour  Intake -  Output 375 ml  Net -375 ml    LBM: Last BM Date: (Unknown) Baseline Weight: Weight: 51.3 kg Most recent weight: Weight: 56.5 kg     Palliative Assessment/Data: PPS 20%   Flowsheet Rows     Most Recent Value  Intake Tab  Referral Department  Critical care  Unit at  Time of Referral  ICU  Palliative Care Primary Diagnosis  Sepsis/Infectious Disease  Palliative Care Type  New Palliative care  Date first seen by Palliative Care  02/10/18  Clinical Assessment  Palliative Performance Scale Score  20%  Psychosocial & Spiritual Assessment  Palliative Care Outcomes  Patient/Family meeting held?  Yes  Who was at the meeting?  sister Karen Larson)  [place] goals of care, Counseled regarding hospice, Provided end of life care assistance, Provided psychosocial or spiritual support, ACP counseling assistance      Time In: 6950 Time Out: 7225 Time Total: 23mn Greater than 50%  of this time was spent counseling and coordinating care related to the above assessment and plan.  Signed by:  MIhor Dow FNP-C Palliative Medicine Team  Phone: [phone]: [phone]  Please contact Palliative Medicine Team phone at [phone] questions and concerns.  For individual provider: See AShea Evans

## 2018-02-10 NOTE — Progress Notes (Signed)
PROGRESS NOTE    Karen Larson  ZOX:[id number] DOB: Dec 07, 1940 DOA: 2018/02/18 PCP: Roger Kill, PA-C    Brief Narrative:  77 y/o female with a history of advanced dementia who is a resident of a nursing facility, is brought to the hospital with general decline for approximately 4 days prior to admission.  She was admitted to the hospital with sepsis, dehydration and pneumonia.  She was started on intravenous fluids and antibiotics.  The patient has been refusing to eat and drink prior to admission.  This is likely progression of her dementia.  Prognosis is guarded.   Assessment & Plan:   Principal Problem:   Severe sepsis (HCC) Active Problems:   Cardiomyopathy (HCC)   Chronic obstructive pulmonary disease (HCC)   Congestive heart failure (HCC)   Protein-calorie malnutrition, severe   S/P TAVR (transcatheter aortic valve replacement)   Hypernatremia   Severe dehydration   Leukocytosis   Elevated lactic acid level   HCAP (healthcare-associated pneumonia)   Palliative care by specialist   Goals of care, counseling/discussion   Dementia without behavioral disturbance (HCC)   At high risk for aspiration   1. Sepsis secondary to healthcare associated pneumonia.  Continued on intravenous antibiotics, IV fluids.  Blood cultures have shown no growth.  Respiratory viral panel is negative.  Urine culture shows unidentified organism.  Continue current treatments.   2. Pneumonia, healthcare associated pneumonia versus aspiration pneumonia.  MRSA PCR is negative, discontinued vancomycin.  Currently on Zosyn 3. COPD.  No evidence of wheezing at this time.  Continue on bronchodilators and inhaled steroids.. 4. Hypernatremia.  Related to decreased p.o. intake and severe dehydration.  Improving with hypotonic fluids. 5. Severe protein calorie malnutrition.  Related to decreased p.o. intake in the setting of advanced dementia.  Family has decided against PEG tube placement. 6. Chronic  atrial fibrillation.  Patient is having intermittent episodes of rapid atrial fibrillation.  She is currently on intravenous Lopressor.  Blood pressures have been soft limiting any further treatments.  Anticoagulation currently on hold due to progressive anemia. 7. Acute metabolic encephalopathy, superimposed on baseline dementia.  CT head does not show any acute findings.  The patient does have progressive dementia and has been refusing to eat or drink prior to admission.  She remains lethargic and is unable to take anything by mouth.. 8. Chronic combined CHF.  Ejection fraction 45 to 50% with grade 1 diastolic dysfunction.  Continue to monitor volume status in setting of hydration. 9. Anemia.  Baseline hemoglobin around 8-9.  She did present with a hemoglobin of 10.9, but was likely hemoconcentrated.  With IV hydration, her hemoglobin has trended down to 7.2.  She does not have any obvious gross bleeding.  Will discontinue for the Lovenox for now.  Family is unsure whether they wish patient to receive transfusion.  They will discuss this further. 10. Goals of care.  Palliative care assisting with conversations around goals of care.  Family wishes to continue current treatments to see if she will recover, but if she does not recover are leaning towards hospice.  Her prognosis is poor.   DVT prophylaxis: SCDs Code Status: DNR Family Communication: No family present Disposition Plan: pending hospital course, possible discharge back to SNF once respiratory status has stabilized vs. Hospice if continues to decline   Consultants:   Palliative care  Procedures:     Antimicrobials:   Cefepime 12/6>12/7  Vancomycin 12/6>12/7  Zosyn 12/7>   Subjective: Remains somnolent.  Does not  engage in conversation.  Does not respond to voice and does not follow commands  Objective: Vitals:   02/10/18 1626 02/10/18 1633 02/10/18 1645 02/10/18 1700  BP:   101/74 115/75  Pulse:   (!) 44 61  Resp:    19 (!) 22  Temp:  (!) 97.5 F (36.4 C)    TempSrc: Oral Axillary    SpO2:   97% 99%  Weight:      Height:        Intake/Output Summary (Last 24 hours) at 02/10/2018 1855 Last data filed at 02/10/2018 1633 Gross per 24 hour  Intake -  Output 400 ml  Net -400 ml   Filed Weights   02/08/18 0959 02/09/18 0340 02/10/18 0400  Weight: 51.2 kg 52.5 kg 56.5 kg    Examination:  General exam: Alert, awake, no distress Respiratory system: Bilateral rhonchi. Respiratory effort normal. Cardiovascular system: Irregular. No murmurs, rubs, gallops. Gastrointestinal system: Abdomen is nondistended, soft and nontender. No organomegaly or masses felt. Normal bowel sounds heard. Central nervous system: No focal neurological deficits. Extremities: No C/C/E, +pedal pulses Skin: No rashes, lesions or ulcers Psychiatry: Somnolent, does not open eyes to voice.     Data Reviewed: I have personally reviewed following labs and imaging studies  CBC: Recent Labs  Lab 02/26/2018 1305 02/26/2018 1338 02/08/18 0457 02/09/18 0452 02/10/18 0404  WBC 25.2*  --  25.0* 23.5* 25.4*  NEUTROABS 22.4*  --  22.6* 22.6* 24.4*  HGB 10.2* 10.9* 9.2* 8.3* 7.2*  HCT 34.8* 32.0* 33.1* 29.5* 25.8*  MCV 80.9  --  83.4 83.8 84.9  PLT 554*  --  454* 433* 354   Basic Metabolic Panel: Recent Labs  Lab 02/21/2018 1305 02/17/2018 1338 02/08/18 0457 02/09/18 0452 02/10/18 0404  NA 149* 151* 149* 147* 147*  K 3.4* 3.6 3.4* 3.5 3.5  CL 118* 116* 122* 119* 124*  CO2 21*  --  18* 18* 18*  GLUCOSE 102* 98 107* 209* 205*  BUN 26* 29* 25* 26* 24*  CREATININE 1.01* 1.00 1.10* 1.16* 1.11*  CALCIUM 8.0*  --  7.8* 8.0* 8.0*  MG  --   --  2.0  --   --    GFR: Estimated Creatinine Clearance: 37.9 mL/min (A) (by C-G formula based on SCr of 1.11 mg/dL (H)). Liver Function Tests: Recent Labs  Lab 02/16/2018 1305 02/08/18 0457 02/09/18 0452 02/10/18 0404  AST 43* 75* 37 26  ALT 44 64* 52* 41  ALKPHOS 121 114 104 88    BILITOT 0.5 0.5 0.4 0.6  PROT 6.5 6.0* 5.8* 5.3*  ALBUMIN 2.4* 2.3* 2.0* 1.9*   No results for input(s): LIPASE, AMYLASE in the last 168 hours. No results for input(s): AMMONIA in the last 168 hours. Coagulation Profile: No results for input(s): INR, PROTIME in the last 168 hours. Cardiac Enzymes: Recent Labs  Lab 02/16/2018 1305  TROPONINI <0.03   BNP (last 3 results) No results for input(s): PROBNP in the last 8760 hours. HbA1C: No results for input(s): HGBA1C in the last 72 hours. CBG: Recent Labs  Lab 02/08/18 0743 02/08/18 1211 02/08/18 1615  GLUCAP 116* 78 70   Lipid Profile: No results for input(s): CHOL, HDL, LDLCALC, TRIG, CHOLHDL, LDLDIRECT in the last 72 hours. Thyroid Function Tests: No results for input(s): TSH, T4TOTAL, FREET4, T3FREE, THYROIDAB in the last 72 hours. Anemia Panel: No results for input(s): VITAMINB12, FOLATE, FERRITIN, TIBC, IRON, RETICCTPCT in the last 72 hours. Sepsis Labs: Recent Labs  Lab 02/21/2018 1325  02/06/2018 1527  LATICACIDVEN 2.47* 3.22*    Recent Results (from the past 240 hour(s))  Blood Culture (routine x 2)     Status: None (Preliminary result)   Collection Time: 02/22/2018  1:11 PM  Result Value Ref Range Status   Specimen Description BLOOD LEFT ARM  Final   Special Requests   Final    BOTTLES DRAWN AEROBIC AND ANAEROBIC Blood Culture adequate volume   Culture   Final    NO GROWTH 3 DAYS Performed at [facility], [address], [place], [place] [address]    Report Status PENDING  Incomplete  Blood Culture (routine x 2)     Status: None (Preliminary result)   Collection Time: 02/04/2018  1:24 PM  Result Value Ref Range Status   Specimen Description BLOOD LEFT ARM  Final   Special Requests   Final    BOTTLES DRAWN AEROBIC AND ANAEROBIC Blood Culture adequate volume   Culture   Final    NO GROWTH 3 DAYS Performed at [facility], [address] [place], [place] [address]    Report Status PENDING  Incomplete  Urine  culture     Status: Abnormal (Preliminary result)   Collection Time: 03/04/2018  1:30 PM  Result Value Ref Range Status   Specimen Description   Final    URINE, CLEAN CATCH Performed at [facility], [address] [place], [place] [address]    Special Requests   Final    NONE Performed at [facility], [address], [place], [place] [address]    Culture (A)  Final    >=100,000 COLONIES/mL UNIDENTIFIED ORGANISM Performed at [facility], [address] [place], [place] [address]    Report Status PENDING  Incomplete  Respiratory Panel by PCR     Status: None   Collection Time: 02/20/2018  6:11 PM  Result Value Ref Range Status   Adenovirus NOT DETECTED NOT DETECTED Final   Coronavirus 229E NOT DETECTED NOT DETECTED Final   Coronavirus HKU1 NOT DETECTED NOT DETECTED Final   Coronavirus NL63 NOT DETECTED NOT DETECTED Final   Coronavirus OC43 NOT DETECTED NOT DETECTED Final   Metapneumovirus NOT DETECTED NOT DETECTED Final   Rhinovirus / Enterovirus NOT DETECTED NOT DETECTED Final   Influenza A NOT DETECTED NOT DETECTED Final   Influenza B NOT DETECTED NOT DETECTED Final   Parainfluenza Virus 1 NOT DETECTED NOT DETECTED Final   Parainfluenza Virus 2 NOT DETECTED NOT DETECTED Final   Parainfluenza Virus 3 NOT DETECTED NOT DETECTED Final   Parainfluenza Virus 4 NOT DETECTED NOT DETECTED Final   Respiratory Syncytial Virus NOT DETECTED NOT DETECTED Final   Bordetella pertussis NOT DETECTED NOT DETECTED Final   Chlamydophila pneumoniae NOT DETECTED NOT DETECTED Final   Mycoplasma pneumoniae NOT DETECTED NOT DETECTED Final    Comment: Performed at [facility], [address], [place], [place] [address]  MRSA PCR Screening     Status: None   Collection Time: 03/04/2018  6:11 PM  Result Value Ref Range Status   MRSA by PCR NEGATIVE NEGATIVE Final    Comment:        The GeneXpert MRSA Assay (FDA approved for NASAL specimens only), is one component of a comprehensive MRSA  colonization surveillance program. It is not intended to diagnose MRSA infection nor to guide or monitor treatment for MRSA infections. Performed at [facility], [address], [place], [place] [address]   Culture, respiratory     Status:  None   Collection Time: 02/08/18  2:04 AM  Result Value Ref Range Status   Specimen Description   Final    SPUTUM Performed at [facility], [address] [place], [place] [address]    Special Requests   Final    NONE Performed at [facility], [address], [place], [place] [address]    Gram Stain   Final    ABUNDANT WBC PRESENT, PREDOMINANTLY PMN RARE GRAM POSITIVE RODS Performed at [facility], [address] [place], [place] [address]    Culture RARE STAPHYLOCOCCUS AUREUS  Final   Report Status 02/10/2018 FINAL  Final   Organism ID, Bacteria STAPHYLOCOCCUS AUREUS  Final      Susceptibility   Staphylococcus aureus - MIC*    CIPROFLOXACIN <=0.5 SENSITIVE Sensitive     ERYTHROMYCIN >=8 RESISTANT Resistant     GENTAMICIN <=0.5 SENSITIVE Sensitive     OXACILLIN 0.5 SENSITIVE Sensitive     TETRACYCLINE <=1 SENSITIVE Sensitive     VANCOMYCIN <=0.5 SENSITIVE Sensitive     TRIMETH/SULFA <=10 SENSITIVE Sensitive     CLINDAMYCIN RESISTANT Resistant     RIFAMPIN <=0.5 SENSITIVE Sensitive     Inducible Clindamycin POSITIVE Resistant     * RARE STAPHYLOCOCCUS AUREUS         Radiology Studies: No results found.      Scheduled Meds: . budesonide (PULMICORT) nebulizer solution  0.25 mg Nebulization BID  . ipratropium-albuterol  3 mL Nebulization Q6H  . mouth rinse  15 mL Mouth Rinse BID  . methylPREDNISolone (SOLU-MEDROL) injection  60 mg Intravenous Q12H  . metoprolol tartrate  5 mg Intravenous Q6H   Continuous Infusions: . dextrose 5 % and 0.45 % NaCl with KCl 20 mEq/L 125 mL/hr at 02/10/18 1726  . diltiazem (CARDIZEM) infusion Stopped (02/09/18 2094)  . piperacillin-tazobactam (ZOSYN)  IV 3.375 g (02/10/18 1351)      LOS: 3 days    Time spent:    Erick Blinks, MD [facility] Pager [phone]  If 7PM-7AM, please contact night-coverage [email or web address] Password [facility] 02/10/2018, 6:55 PM

## 2018-02-10 NOTE — Progress Notes (Signed)
Still ntsx  Small to moderate amount of yellow thick secretions. Off BiPAP able to cough with stimulus.Mouth care patient has many dental problems. Patient still confused not as bad.

## 2018-02-10 NOTE — Progress Notes (Signed)
PMT consult received and chart reviewed. Patient assessment completed. No family at bedside. Spoke with sister [name]) who will meet with me around 2pm today for GOC. Thank you.   NO CHARGE  Vennie Homans, FNP-C Palliative Medicine Team  Phone: [phone] Fax: [phone]

## 2018-02-10 NOTE — Clinical Social Work Note (Signed)
Met with [name] [phone], sister of patient.  With the help of Palliative this afternoon, she decided to wait a couple of days to see if the antibiotics make any difference.  If so, plan would be to return to [facility] .  If not, would prefer [facility] residential.  Also made it clear that they have decided against a feeding tube.  Let her know CSW would follow up again tomorrow to see where things stand.

## 2018-02-10 NOTE — Care Management Important Message (Signed)
Important Message  Patient Details  Name: Karen Larson MRN: [id number] Date of Birth: June 10, 1940   Medicare Important Message Given:  Yes    Renie Ora 02/10/2018, 2:39 PM

## 2018-02-11 DIAGNOSIS — I429 Cardiomyopathy, unspecified: Secondary | ICD-10-CM

## 2018-02-11 DIAGNOSIS — J181 Lobar pneumonia, unspecified organism: Secondary | ICD-10-CM

## 2018-02-11 DIAGNOSIS — K117 Disturbances of salivary secretion: Secondary | ICD-10-CM

## 2018-02-11 DIAGNOSIS — F0390 Unspecified dementia without behavioral disturbance: Secondary | ICD-10-CM

## 2018-02-11 DIAGNOSIS — R0600 Dyspnea, unspecified: Secondary | ICD-10-CM

## 2018-02-11 LAB — URINE CULTURE: Culture: >=100000 — AB

## 2018-02-11 LAB — COMPREHENSIVE METABOLIC PANEL
ALT: 137 U/L — ABNORMAL HIGH (ref 0–44)
AST: 206 U/L — AB (ref 15–41)
Albumin: 2.1 g/dL — ABNORMAL LOW (ref 3.5–5.0)
Alkaline Phosphatase: 102 U/L (ref 38–126)
Anion gap: 6 (ref 5–15)
BUN: 26 mg/dL — AB (ref 8–23)
CO2: 15 mmol/L — ABNORMAL LOW (ref 22–32)
Calcium: 8.1 mg/dL — ABNORMAL LOW (ref 8.9–10.3)
Chloride: 124 mmol/L — ABNORMAL HIGH (ref 98–111)
Creatinine, Ser: 1.25 mg/dL — ABNORMAL HIGH (ref 0.44–1.00)
GFR calc Af Amer: 48 mL/min — ABNORMAL LOW (ref >60)
GFR calc non Af Amer: 41 mL/min — ABNORMAL LOW (ref >60)
Glucose, Bld: 179 mg/dL — ABNORMAL HIGH (ref 70–99)
Potassium: 5.2 mmol/L — ABNORMAL HIGH (ref 3.5–5.1)
SODIUM: 145 mmol/L (ref 135–145)
Total Bilirubin: 0.7 mg/dL (ref 0.3–1.2)
Total Protein: 5.7 g/dL — ABNORMAL LOW (ref 6.5–8.1)

## 2018-02-11 LAB — CBC WITH DIFFERENTIAL/PLATELET
ABS IMMATURE GRANULOCYTES: 0.58 10*3/uL — AB (ref 0.00–0.07)
Basophils Absolute: 0 10*3/uL (ref 0.0–0.1)
Basophils Relative: 0 %
Eosinophils Absolute: 0 10*3/uL (ref 0.0–0.5)
Eosinophils Relative: 0 %
HCT: 29.1 % — ABNORMAL LOW (ref 36.0–46.0)
Hemoglobin: 8 g/dL — ABNORMAL LOW (ref 12.0–15.0)
Immature Granulocytes: 2 %
LYMPHS PCT: 1 %
Lymphs Abs: 0.4 10*3/uL — ABNORMAL LOW (ref 0.7–4.0)
MCH: 23.1 pg — ABNORMAL LOW (ref 26.0–34.0)
MCHC: 27.5 g/dL — ABNORMAL LOW (ref 30.0–36.0)
MCV: 84.1 fL (ref 80.0–100.0)
Monocytes Absolute: 0.7 10*3/uL (ref 0.1–1.0)
Monocytes Relative: 2 %
Neutro Abs: 26.6 10*3/uL — ABNORMAL HIGH (ref 1.7–7.7)
Neutrophils Relative %: 95 %
Platelets: 295 10*3/uL (ref 150–400)
RBC: 3.46 MIL/uL — ABNORMAL LOW (ref 3.87–5.11)
RDW: 20.1 % — ABNORMAL HIGH (ref 11.5–15.5)
WBC: 28.3 10*3/uL — ABNORMAL HIGH (ref 4.0–10.5)
nRBC: 2.8 % — ABNORMAL HIGH (ref 0.0–0.2)

## 2018-02-11 MED ORDER — METOPROLOL TARTRATE 5 MG/5ML IV SOLN
5.0000 mg | Freq: Four times a day (QID) | INTRAVENOUS | Status: DC | PRN
  Administered 2018-02-11: 5 mg via INTRAVENOUS
  Filled 2018-02-11: qty 5

## 2018-02-11 MED ORDER — SODIUM BICARBONATE 8.4 % IV SOLN
INTRAVENOUS | Status: DC
  Administered 2018-02-11: 08:00:00 via INTRAVENOUS
  Filled 2018-02-11 (×3): qty 1000

## 2018-02-11 MED ORDER — SCOPOLAMINE 1 MG/3DAYS TD PT72
1.0000 | MEDICATED_PATCH | TRANSDERMAL | Status: DC
  Administered 2018-02-11: 1.5 mg via TRANSDERMAL
  Filled 2018-02-11: qty 1

## 2018-02-11 MED ORDER — BISACODYL 10 MG RE SUPP: 10.0000 mg | Freq: Every day | RECTAL | Status: DC | PRN

## 2018-02-11 MED ORDER — GLYCOPYRROLATE 0.2 MG/ML IJ SOLN
0.2000 mg | INTRAMUSCULAR | Status: DC | PRN
  Administered 2018-02-11 (×2): 0.2 mg via INTRAVENOUS
  Filled 2018-02-11 (×2): qty 1

## 2018-02-11 MED ORDER — MORPHINE SULFATE (PF) 2 MG/ML IV SOLN
1.0000 mg | INTRAVENOUS | Status: DC | PRN
  Administered 2018-02-11 (×2): 2 mg via INTRAVENOUS
  Filled 2018-02-11 (×2): qty 1

## 2018-02-12 LAB — CULTURE, BLOOD (ROUTINE X 2)
CULTURE: NO GROWTH
Culture: NO GROWTH
Special Requests: ADEQUATE
Special Requests: ADEQUATE

## 2018-02-12 NOTE — Discharge Summary (Signed)
Death Summary  Karen Larson ZOX:[id number] DOB: 11-25-1940 DOA: 2018-02-27  PCP: Roger Kill, PA-C  Admit date: February 27, 2018 Date of Death: 03/04/18 Time of Death: 23:00   History of present illness:  Karen Larson is a 77 y.o. female with a history of advanced dementia who is a resident of a nursing facility.  She was brought to the hospital with general decline for approximately 4 days prior to admission.  She was admitted to the hospital with sepsis, dehydration and pneumonia.  It was likely that her pneumonia was related to aspiration.  She was treated with intravenous antibiotics, IV fluids.  Respiratory viral panel was found to be negative.  She was continued on bronchodilators for COPD.    Despite aggressive treatment, patient did not have any significant improvement.  She repeatedly went into rapid atrial fibrillation which was difficult to control due to low blood pressures.  She had persistent metabolic encephalopathy superimposed on her baseline dementia.  She remained extremely lethargic throughout hospital course and was unable to take anything by mouth.  She was not responsive, following commands or answering questions.  Despite aggressive treatment, her overall condition continued decline.  She was seen by palliative care who assisted with conversations around goals of care.  Family agreed to DNR and eventually transition to comfort care.  Patient was placed on morphine for respiratory distress and pain management.  She passed away at 23:00 on Mar 04, 2023.  Family was informed.  Final Diagnoses:  Principal Problem:   Severe sepsis (HCC) Active Problems:   Cardiomyopathy (HCC)   Chronic obstructive pulmonary disease (HCC)   Congestive heart failure (HCC)   Dyspnea   Protein-calorie malnutrition, severe   S/P TAVR (transcatheter aortic valve replacement)   Hypernatremia   Severe dehydration   Leukocytosis   Elevated lactic acid level   HCAP (healthcare-associated  pneumonia)   Palliative care by specialist   Terminal care   Dementia without behavioral disturbance (HCC)   At high risk for aspiration   Increased oropharyngeal secretions     The results of significant diagnostics from this hospitalization (including imaging, microbiology, ancillary and laboratory) are listed below for reference.    Significant Diagnostic Studies: Ct Head Wo Contrast  Result Date: February 27, 2018 CLINICAL DATA:  Altered mental status, worsening for 5 days. Incontinence. History of dementia, hypertension and atrial fibrillation. EXAM: CT HEAD WITHOUT CONTRAST TECHNIQUE: Contiguous axial images were obtained from the base of the skull through the vertex without intravenous contrast. COMPARISON:  CT HEAD Jul 31, 2017 FINDINGS: Mild motion degraded examination. BRAIN: No intraparenchymal hemorrhage, mass effect nor midline shift. Mild ex vacuo dilatation LEFT frontal horn. No hydrocephalus. Patchy predominately bifrontal supratentorial white matter hypodensities are similar. No acute large vascular territory infarcts. No abnormal extra-axial fluid collections. Basal cisterns are patent. VASCULAR: Moderate calcific atherosclerosis of the carotid siphons. SKULL: No skull fracture. No significant scalp soft tissue swelling. SINUSES/ORBITS: Trace paranasal sinus mucosal thickening. Mastoid air cells are well aerated.The included ocular globes and orbital contents are non-suspicious. OTHER: None. IMPRESSION: 1. No acute intracranial process on this mildly motion degraded examination. 2. Stable examination including mild asymmetric LEFT frontal volume loss associated with neuro degenerative syndromes. Electronically Signed   By: Awilda Metro M.D.   On: 02/27/2018 20:03   Dg Chest Port 1 View  Result Date: 02/08/2018 CLINICAL DATA:  Pneumonia. EXAM: PORTABLE CHEST 1 VIEW COMPARISON:  Chest x-ray from yesterday. FINDINGS: Unchanged left chest wall pacemaker. Stable cardiomediastinal  silhouette status post TAVR.  Atherosclerotic calcification of the aortic arch. Diffuse airspace disease within the right mid to lower lung is unchanged. Mild atelectasis at the left lung base. No pleural effusion or pneumothorax. No acute osseous abnormality. IMPRESSION: Unchanged multifocal pneumonia in the right lung. Electronically Signed   By: Obie Dredge M.D.   On: 02/08/2018 09:14   Dg Chest Portable 1 View  Result Date: 03/02/2018 CLINICAL DATA:  Acute shortness of breath. EXAM: PORTABLE CHEST 1 VIEW COMPARISON:  08/05/2017 FINDINGS: Diffuse airspace opacities within the mid and LOWER RIGHT lung noted with small RIGHT pleural effusion. Cardiomediastinal silhouette is unchanged. Aortic valve replacement and LEFT-sided pacemaker again noted. Mild vascular congestion noted. There is no evidence of pneumothorax. IMPRESSION: RIGHT lung airspace opacities, question infection versus asymmetric edema. Small RIGHT pleural effusion. Mild pulmonary vascular congestion. Electronically Signed   By: Harmon Pier M.D.   On: 02/22/2018 13:31    Microbiology: Recent Results (from the past 240 hour(s))  Blood Culture (routine x 2)     Status: None   Collection Time: 02/15/2018  1:11 PM  Result Value Ref Range Status   Specimen Description BLOOD LEFT ARM  Final   Special Requests   Final    BOTTLES DRAWN AEROBIC AND ANAEROBIC Blood Culture adequate volume   Culture   Final    NO GROWTH 5 DAYS Performed at [facility], [address] [place], [place] [address]    Report Status 02/12/2018 FINAL  Final  Blood Culture (routine x 2)     Status: None   Collection Time: 03/01/2018  1:24 PM  Result Value Ref Range Status   Specimen Description BLOOD LEFT ARM  Final   Special Requests   Final    BOTTLES DRAWN AEROBIC AND ANAEROBIC Blood Culture adequate volume   Culture   Final    NO GROWTH 5 DAYS Performed at [facility], [address], [place], [place] [address]    Report Status 02/12/2018 FINAL  Final    Urine culture     Status: Abnormal   Collection Time: 02/15/2018  1:30 PM  Result Value Ref Range Status   Specimen Description   Final    URINE, CLEAN CATCH Performed at [facility], [address] [place], [place] [address]    Special Requests   Final    NONE Performed at [facility], [address], [place], [place] [address]    Culture >=100,000 COLONIES/mL VIRIDANS STREPTOCOCCUS (A)  Final   Report Status 2018-02-28 FINAL  Final  Respiratory Panel by PCR     Status: None   Collection Time: 02/16/2018  6:11 PM  Result Value Ref Range Status   Adenovirus NOT DETECTED NOT DETECTED Final   Coronavirus 229E NOT DETECTED NOT DETECTED Final   Coronavirus HKU1 NOT DETECTED NOT DETECTED Final   Coronavirus NL63 NOT DETECTED NOT DETECTED Final   Coronavirus OC43 NOT DETECTED NOT DETECTED Final   Metapneumovirus NOT DETECTED NOT DETECTED Final   Rhinovirus / Enterovirus NOT DETECTED NOT DETECTED Final   Influenza A NOT DETECTED NOT DETECTED Final   Influenza B NOT DETECTED NOT DETECTED Final   Parainfluenza Virus 1 NOT DETECTED NOT DETECTED Final   Parainfluenza Virus 2 NOT DETECTED NOT DETECTED Final   Parainfluenza Virus 3 NOT DETECTED NOT DETECTED Final   Parainfluenza Virus 4 NOT DETECTED NOT DETECTED Final   Respiratory Syncytial Virus NOT DETECTED NOT DETECTED Final   Bordetella pertussis NOT DETECTED NOT DETECTED Final   Chlamydophila pneumoniae NOT DETECTED NOT  DETECTED Final   Mycoplasma pneumoniae NOT DETECTED NOT DETECTED Final    Comment: Performed at [facility], [address], [place], [place] [address]  MRSA PCR Screening     Status: None   Collection Time: Feb 22, 2018  6:11 PM  Result Value Ref Range Status   MRSA by PCR NEGATIVE NEGATIVE Final    Comment:        The GeneXpert MRSA Assay (FDA approved for NASAL specimens only), is one component of a comprehensive MRSA colonization surveillance program. It is not intended to diagnose MRSA infection nor to  guide or monitor treatment for MRSA infections. Performed at [facility], [address], [place], [place] [address]   Culture, respiratory     Status: None   Collection Time: 02/08/18  2:04 AM  Result Value Ref Range Status   Specimen Description   Final    SPUTUM Performed at [facility], [address], [place], [place] [address]    Special Requests   Final    NONE Performed at [facility], [address] [place], [place] [address]    Gram Stain   Final    ABUNDANT WBC PRESENT, PREDOMINANTLY PMN RARE GRAM POSITIVE RODS Performed at [facility], [address] [place], [place] [address]    Culture RARE STAPHYLOCOCCUS AUREUS  Final   Report Status 02/10/2018 FINAL  Final   Organism ID, Bacteria STAPHYLOCOCCUS AUREUS  Final      Susceptibility   Staphylococcus aureus - MIC*    CIPROFLOXACIN <=0.5 SENSITIVE Sensitive     ERYTHROMYCIN >=8 RESISTANT Resistant     GENTAMICIN <=0.5 SENSITIVE Sensitive     OXACILLIN 0.5 SENSITIVE Sensitive     TETRACYCLINE <=1 SENSITIVE Sensitive     VANCOMYCIN <=0.5 SENSITIVE Sensitive     TRIMETH/SULFA <=10 SENSITIVE Sensitive     CLINDAMYCIN RESISTANT Resistant     RIFAMPIN <=0.5 SENSITIVE Sensitive     Inducible Clindamycin POSITIVE Resistant     * RARE STAPHYLOCOCCUS AUREUS     Labs: Basic Metabolic Panel: Recent Labs  Lab 2018-02-22 1305 2018-02-22 1338 02/08/18 0457 02/09/18 0452 02/10/18 0404 02/28/2018 0602  NA 149* 151* 149* 147* 147* 145  K 3.4* 3.6 3.4* 3.5 3.5 5.2*  CL 118* 116* 122* 119* 124* 124*  CO2 21*  --  18* 18* 18* 15*  GLUCOSE 102* 98 107* 209* 205* 179*  BUN 26* 29* 25* 26* 24* 26*  CREATININE 1.01* 1.00 1.10* 1.16* 1.11* 1.25*  CALCIUM 8.0*  --  7.8* 8.0* 8.0* 8.1*  MG  --   --  2.0  --   --   --    Liver Function Tests: Recent Labs  Lab February 22, 2018 1305 02/08/18 0457 02/09/18 0452 02/10/18 0404 02/25/2018 0602  AST 43* 75* 37 26 206*  ALT 44 64* 52* 41 137*  ALKPHOS 121 114 104 88 102  BILITOT  0.5 0.5 0.4 0.6 0.7  PROT 6.5 6.0* 5.8* 5.3* 5.7*  ALBUMIN 2.4* 2.3* 2.0* 1.9* 2.1*   No results for input(s): LIPASE, AMYLASE in the last 168 hours. No results for input(s): AMMONIA in the last 168 hours. CBC: Recent Labs  Lab 2018-02-22 1305 22-Feb-2018 1338 02/08/18 0457 02/09/18 0452 02/10/18 0404 03/04/2018 0418  WBC 25.2*  --  25.0* 23.5* 25.4* 28.3*  NEUTROABS 22.4*  --  22.6* 22.6* 24.4* 26.6*  HGB 10.2* 10.9* 9.2* 8.3* 7.2* 8.0*  HCT 34.8* 32.0* 33.1* 29.5* 25.8* 29.1*  MCV 80.9  --  83.4 83.8 84.9 84.1  PLT 554*  --  454* 433* 354 295   Cardiac Enzymes: Recent Labs  Lab 02/12/2018 1305  TROPONINI <0.03   D-Dimer No results for input(s): DDIMER in the last 72 hours. BNP: Invalid input(s): POCBNP CBG: Recent Labs  Lab 02/08/18 0743 02/08/18 1211 02/08/18 1615  GLUCAP 116* 78 70   Anemia work up No results for input(s): VITAMINB12, FOLATE, FERRITIN, TIBC, IRON, RETICCTPCT in the last 72 hours. Urinalysis    Component Value Date/Time   COLORURINE AMBER (A) 02/13/2018 1330   APPEARANCEUR CLOUDY (A) 02/23/2018 1330   LABSPEC 1.024 02/10/2018 1330   PHURINE 5.0 02/06/2018 1330   GLUCOSEU NEGATIVE 02/17/2018 1330   HGBUR SMALL (A) 02/22/2018 1330   BILIRUBINUR NEGATIVE 03/04/2018 1330   KETONESUR 5 (A) 02/20/2018 1330   PROTEINUR 30 (A) 02/16/2018 1330   NITRITE NEGATIVE 02/17/2018 1330   LEUKOCYTESUR NEGATIVE 02/16/2018 1330   Sepsis Labs Invalid input(s): PROCALCITONIN,  WBC,  LACTICIDVEN     SIGNED:  Erick Blinks, MD  [facility] 02/12/2018, 7:36 PM Pager   If 7PM-7AM, please contact night-coverage [email or web address] Password [email or web address]

## 2018-03-05 NOTE — Progress Notes (Signed)
Daily Progress Note   Patient Name: Karen Larson       Date: 02/17/2018 DOB: [birth year]  Age: 78 y.o. MRN#: [id number] Attending Physician: Erick Blinks, MD Primary Care Physician: Bernita Buffy Admit Date: 02/16/2018  Reason for Consultation/Follow-up: Establishing goals of care and Terminal Care  Subjective: Patient lethargic to sternal rub. Appears uncomfortable with labored breathing, audible secretions, and tachycardia. RN instructed to give prn morphine and scopolamine patch.   GOC:  Spoke with sister, [name] via telephone. Explained declining clinical condition including worsening labs, vitals, and her sister remaining lethargic. Discussed worsening sepsis with underlying dementia. Discussed poor prognosis. Educated on Dr. York Pellant and my recommendation for comfort measures in order to allow comfort, peace, and dignity at EOL. Educated on interventions not aimed at comfort being discontinued and initiation of comfort meds for pain, dyspnea, anxiety, etc. [name] confirms understanding and agrees with shift to comfort and hospice home referral. I did discuss that if Karen Larson seemed to unstable for transfer, we would not move her to hospice home. [name] confirms understanding. [name] will speak with sisters. She has PMT contact information and understands to call with questions or concerns.   Length of Stay: 4  Current Medications: Scheduled Meds:  . mouth rinse  15 mL Mouth Rinse BID  . scopolamine  1 patch Transdermal Q72H    Continuous Infusions:   PRN Meds: albuterol, bisacodyl, glycopyrrolate, LORazepam, metoprolol tartrate, morphine injection  Physical Exam  Constitutional: She appears lethargic. She appears ill.  HENT:  Head: Normocephalic and atraumatic.    Cardiovascular: Tachycardia present.  Pulmonary/Chest: Accessory muscle usage present. No tachypnea. No respiratory distress. She has decreased breath sounds. She has rhonchi.  Labored respirations.   Abdominal: There is no tenderness.  Neurological: She appears lethargic.  Skin: Abrasion noted.  Psychiatric: Cognition and memory are impaired. She is noncommunicative. She is inattentive.  Nursing note and vitals reviewed.          Vital Signs: BP 112/82   Pulse 67   Temp (!) 97.4 F (36.3 C) (Oral)   Resp (!) 25   Ht 5\' 6"  (1.676 m) Comment: Taken from 10/16 office visit  Wt 51.9 kg   SpO2 98%   BMI 18.47 kg/m  SpO2: SpO2: 98 % O2 Device: O2 Device: High Flow Nasal Cannula O2  Flow Rate: O2 Flow Rate (L/min): 4 L/min  Intake/output summary:   Intake/Output Summary (Last 24 hours) at 02/10/2018 5409 Last data filed at 02/18/2018 0400 Gross per 24 hour  Intake 4401.91 ml  Output 400 ml  Net 4001.91 ml   LBM: Last BM Date: (unknown) Baseline Weight: Weight: 51.3 kg Most recent weight: Weight: 51.9 kg       Palliative Assessment/Data: PPS 10%   Flowsheet Rows     Most Recent Value  Intake Tab  Referral Department  Critical care  Unit at Time of Referral  ICU  Palliative Care Primary Diagnosis  Sepsis/Infectious Disease  Palliative Care Type  New Palliative care  Date first seen by Palliative Care  02/10/18  Clinical Assessment  Palliative Performance Scale Score  10%  Psychosocial & Spiritual Assessment  Palliative Care Outcomes  Patient/Family meeting held?  Yes  Who was at the meeting?  sister [name])  Palliative Care Outcomes  Clarified goals of care, Counseled regarding hospice, Provided end of life care assistance, Improved pain interventions, Improved non-pain symptom therapy, Changed to focus on comfort, Provided psychosocial or spiritual support, Transitioned to hospice, ACP counseling assistance      Patient Active Problem List   Diagnosis Date Noted   . Palliative care by specialist   . Goals of care, counseling/discussion   . Dementia without behavioral disturbance (HCC)   . At high risk for aspiration   . Severe sepsis (HCC) February 15, 2018  . Hypernatremia 02-15-18  . Severe dehydration 02/15/2018  . Leukocytosis February 15, 2018  . Elevated lactic acid level 02-15-2018  . HCAP (healthcare-associated pneumonia) 02/15/18  . COPD with acute bronchitis (HCC) 08/06/2017  . Sepsis (HCC) 08/06/2017  . 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 Malnutrition of moderate degree 08/03/2017  . Protein-calorie malnutrition, severe 08/09/2016  . Hypokalemia 08/08/2016  . Hypokalemia due to loss of potassium 08/08/2016  . Dyspnea on exertion 08/08/2016  . Chronic obstructive pulmonary disease (HCC) 08/07/2016  . Congestive heart failure (HCC) 08/07/2016  . Cardiomyopathy (HCC) 10/29/2012  . CHF (congestive heart failure) (HCC) 08/18/2012    Palliative Care Assessment & Plan   Patient Profile: 78 y.o. female  with past medical history of dementia, hypothyroidism, depression, HTN, falls, COPD, CHF, cardiomyopathy, afib, arthritis, aortic stenosis, pacemaker placement admitted on 02/15/2018 with confusion and general decline x4 days. Admitted with sepsis, dehydration, pneumonia. Started on IVF and antibiotics. Also with afib RVR, cardizem gtt discontinued due to hypotension. CT head negative for acute findings. Clinically has not shown improvement and remains NPO with severe aspiration risk. Palliative medicine consultation for goals of care.   Assessment: Sepsis HCAP COPD Hypernatremia Severe protein calorie malnutrition Chronic atrial fibrillation Acute metabolic encephalopathy with baseline dementia Chronic combined CHF Anemia  Recommendations/Plan:  Further discussed GOC with sister [name]) and concern with worsening clinical condition this morning. [name] understands diagnoses, interventions, and poor prognosis. [name]  agrees with shift to comfort measures only in order to allow comfort, peace, and dignity at EOL.   Shift to comfort. Sister understands interventions not aimed at comfort will be discontinued.   Symptom management  Morphine 1-2mg  IV q1h prn pain/dyspnea/air hunger/tachypnea  Ativan 0.5mg  IV q3h prn anxiety/agitaiton  Scopolamine patch  Robinul 0.2mg  IV q4h prn excess secretions  Dulcolax suppository daily prn  SW consulted for residential hospice placement.    Goals of Care and Additional Recommendations:  Limitations on Scope of Treatment: Full Comfort Care  Code Status: DNR/DNI   Code Status Orders  (  From admission, onward)         Start     Ordered   02/08/18 1231  Do not attempt resuscitation (DNR)  Continuous    Question Answer Comment  In the event of cardiac or respiratory ARREST Do not call a "code blue"   In the event of cardiac or respiratory ARREST Do not perform Intubation, CPR, defibrillation or ACLS   In the event of cardiac or respiratory ARREST Use medication by any route, position, wound care, and other measures to relive pain and suffering. May use oxygen, suction and manual treatment of airway obstruction as needed for comfort.      02/08/18 1230        Code Status History    Date Active Date Inactive Code Status Order ID Comments User Context   February 24, 2018 1810 02/08/2018 1230 Full Code [id number]  Cleora [name], MD Inpatient   08/01/2017 0753 08/06/2017 2052 Full Code [id number]  Onnie Boer, MD ED   08/08/2016 1512 08/09/2016 2041 Full Code [id number]  Eulah Pont, MD Inpatient       Prognosis:   Hours - Days  Discharge Planning:  Hospice home vs. Hospital death  Care plan was discussed with sister, RN, Dr. Kerry Hough  Thank you for allowing the Palliative Medicine Team to assist in the care of this patient.   Time In: 0845- Time Out: 0925 Total Time 40 Prolonged Time Billed  no      Greater than 50%  of this time was spent  counseling and coordinating care related to the above assessment and plan.  Vennie Homans, DNP, FNP-C Palliative Medicine Team  Phone: [phone] Fax: [phone]  Please contact Palliative Medicine Team phone at [phone] for questions and concerns.

## 2018-03-05 NOTE — Clinical Social Work Note (Signed)
Clinical Social Work Assessment  Patient Details  Name: Karen Larson MRN: [id number] Date of Birth: 02-14-1941  Date of referral:  02/04/2018               Reason for consult:  End of Life/Hospice                Permission sought to share information with:  Oceanographer granted to share information::  Yes, Verbal Permission Granted  Name::        Agency::  [facility]  Relationship::     Contact Information:     Housing/Transportation Living arrangements for the past 2 months:  Single Family Home, Skilled Nursing Facility Source of Information:  Siblings Patient Interpreter Needed:  None Criminal Activity/Legal Involvement Pertinent to Current Situation/Hospitalization:  No - Comment as needed Significant Relationships:  Siblings Lives with:  Facility Resident Do you feel safe going back to the place where you live?  Yes Need for family participation in patient care:  No (Coment)  Care giving concerns: Pt is end of life.   Social Worker assessment / plan: Pt is a 78 year old female referred to CSW for Residential Hospice placement. Discussed pt's status with MD who stated that pt is not stable to move today but could be GIP if hospice determined that was appropriate. Spoke with pt's sister at bedside and she is agreeable with hospice referral. She requests [facility]. Sp[facility] with Cassandra at [facility] and also sent referral clinical electronically. CSW will follow and assist as needed.  Employment status:  Retired Health and safety inspector:  Medicare PT Recommendations:  Not assessed at this time Information / Referral to community resources:  Other (Comment Required)(Hospice)  Patient/Family's Response to care: Pt and family accepting of care.  Patient/Family's Understanding of and Emotional Response to Diagnosis, Current Treatment, and Prognosis: Pt is non responsive at this time. Pt's sister appears to have a good  understanding of diagnosis and prognosis. Emotional support provided.  Emotional Assessment Appearance:  Appears stated age Attitude/Demeanor/Rapport:  Unresponsive Affect (typically observed):  Unable to Assess Orientation:    Alcohol / Substance use:  Not Applicable Psych involvement (Current and /or in the community):  No (Comment)  Discharge Needs  Concerns to be addressed:  Discharge Planning Concerns Readmission within the last 30 days:  No Current discharge risk:  Terminally ill Barriers to Discharge:  No Barriers Identified   Elliot Gault, LCSW 02/27/2018, 12:00 PM

## 2018-03-05 NOTE — Progress Notes (Signed)
Nutrition Brief Note  Chart reviewed. Pt now transitioning to comfort care.  No further nutrition interventions warranted at this time.  Please re-consult as needed.   Lynn Crewe Heathman MS,RD,CSG,LDN Office: #[phone] Pager: #[phone]    

## 2018-03-05 NOTE — Progress Notes (Signed)
Follow-up with patient, two sisters Karen Larson and Karen Larson) and Karen Larson at bedside. This afternoon, Karen Larson looks comfortable. Respirations are shallow. Extremities are cool. Discussed again plan for comfort in order to allow dignity at EOL. Educated on comfort medications and EOL expectations. Prepared sisters that Karen Larson may not be stable for transfer to hospice facility and may pass in the hospital. Explained that Dr. York Pellant and I will assess in the morning. Therapeutic listening and emotional/spiritual support provided. Answered all questions and concerns.   NO CHARGE  Vennie Homans, DNP, FNP-C Palliative Medicine Team  Phone: [phone] Fax: [phone]

## 2018-03-05 NOTE — Clinical Social Work Note (Signed)
Karen Larson, Karen Larson #[id number] (CSN: [id number]) (78 y.o. F) (Adm: February 17, 2018)  [id number]-[id number]  PCP   Rueben Bash J  Demographics  Comment    Last edited by  on at   Address: Home Phone: Work Phone: Mobile Phone:  [address]  [place] [address]   [phone] -- --  SSN: Insurance: Marital Status: Religion:  [id number] MEDICARE Legally Separated (none)  Patient Ethnicity & Race   Ethnic Group Patient Race  Not Hispanic or Latino White or Caucasian  Documents Filed to Patient   Power of Attorney Living Will Clinical Unknown Study Attachment Consent Form ABN Waiver After Visit Summary Lab Result Scan Code Status MyChart Status Advance Care Planning  Not on File Not on File Not on File Not on File Filed Not on File Filed Not on File DNR [Updated on 02/08/18 1230] Code Exp Jump to the Activity  Admission Information   Attending Provider Admitting Provider Admission Type Admission Date/Time  Erick Blinks, MD Cleora [name], MD Emergency 2018/02/17 1235  Discharge Date Hospital Service Auth/Cert Status Service Area   Internal Medicine Incomplete [facility]  Unit Room/Bed Admission Status   AP-ICCUP NURSING IC05/IC05-01 Admission (Confirmed)   Hospital Account   Name Acct ID Class Status Primary Coverage  Marybella, Cran [id number] Inpatient Open MEDICARE - MEDICARE PART A AND B      Guarantor Account (for Hospital Account [id number])   Name Relation to Pt Service Area Active? Acct Type  Marguerita Beards Yes Personal/Family  Address Phone    [address] [place], [place] [address] [phone](H)        Coverage Information (for Hospital Account [id number])   1. MEDICARE/MEDICARE PART A AND B   F/O Payor/Plan Precert #  MEDICARE/MEDICARE PART A AND B   Subscriber Subscriber #  Urijah, Roso [id number]  Address Phone  [address] [place], [place] [address]   2. [employer] [place] [facility] MCR SUP   F/O  Payor/Plan Precert #  MUTUAL OF [facility] OF [facility] SUP   Subscriber Subscriber #  Theona, Higbie [id number]  Address Phone  [address] [place], [place] [address] [phone]

## 2018-03-05 NOTE — Progress Notes (Addendum)
PROGRESS NOTE    Karen Larson  ZOX:[id number] DOB: 20-May-1940 DOA: 02/09/18 PCP: Roger Kill, PA-C    Brief Narrative:  78 y/o female with a history of advanced dementia who is a resident of a nursing facility, is brought to the hospital with general decline for approximately 4 days prior to admission.  She was admitted to the hospital with sepsis, dehydration and pneumonia.  She was started on intravenous fluids and antibiotics.  The patient has been refusing to eat and drink prior to admission.  This is likely progression of her dementia.  Prognosis is guarded.   Assessment & Plan:   Principal Problem:   Severe sepsis (HCC) Active Problems:   Cardiomyopathy (HCC)   Chronic obstructive pulmonary disease (HCC)   Congestive heart failure (HCC)   Dyspnea   Protein-calorie malnutrition, severe   S/P TAVR (transcatheter aortic valve replacement)   Hypernatremia   Severe dehydration   Leukocytosis   Elevated lactic acid level   HCAP (healthcare-associated pneumonia)   Palliative care by specialist   Terminal care   Dementia without behavioral disturbance (HCC)   At high risk for aspiration   Increased oropharyngeal secretions   1. Sepsis secondary to healthcare associated pneumonia.  Currently on intravenous antibiotics, IV fluids.  Blood cultures have shown no growth.  Respiratory viral panel is negative.  Urine culture shows viridans Streptococcus.     2. Pneumonia, healthcare associated pneumonia versus aspiration pneumonia.  MRSA PCR is negative, discontinued vancomycin.  Currently on Zosyn 3. COPD.  No evidence of wheezing at this time.  Continue on bronchodilators and inhaled steroids.. 4. Hypernatremia.  Related to decreased p.o. intake and severe dehydration.  Improving with hypotonic fluids. 5. Severe protein calorie malnutrition.  Related to decreased p.o. intake in the setting of advanced dementia.  Family has decided against PEG tube placement. 6. Chronic  atrial fibrillation.  Patient is having intermittent episodes of rapid atrial fibrillation.  She is currently on intravenous Lopressor.  Blood pressures have been soft limiting any further treatments.  Anticoagulation currently on hold due to progressive anemia. 7. Acute metabolic encephalopathy, superimposed on baseline dementia.  CT head does not show any acute findings.  The patient does have progressive dementia and has been refusing to eat or drink prior to admission.  She remains lethargic and is unable to take anything by mouth.. 8. Chronic combined CHF.  Ejection fraction 45 to 50% with grade 1 diastolic dysfunction.   9. Anemia, likely iron deficiency, chronic.  Baseline hemoglobin around 8-9.  She did present with a hemoglobin of 10.9, but was likely hemoconcentrated.  With IV hydration, her hemoglobin has trended down to 7.2.  She does not have any obvious gross bleeding.  10. Goals of care.  Palliative care assisting with conversations around goals of care.  Family has agreed to transition the patient to comfort care and enroll in hospice services.  All medications not related to the patient's comfort of been discontinued.  Patient may have in-hospital death.  Continue to monitor.   DVT prophylaxis: SCDs Code Status: DNR Family Communication: No family present Disposition Plan: Anticipate in-hospital death.  If stabilizes, consider residential hospice   Consultants:   Palliative care  Procedures:     Antimicrobials:   Cefepime 12/6>12/7  Vancomycin 12/6>12/7  Zosyn 12/7>12/10   Subjective: Unresponsive.  Does not respond to voice  Objective: Vitals:   02/20/2018 0700 02/17/2018 0800 02/28/2018 0819 02/24/2018 0900  BP: 112/82 123/81  117/89  Pulse:    Marland Kitchen)  50  Resp: (!) 25 (!) 25  (!) 25  Temp:   (!) 97.4 F (36.3 C)   TempSrc:   Oral   SpO2:    97%  Weight:      Height:        Intake/Output Summary (Last 24 hours) at 02/23/2018 1742 Last data filed at 02/17/2018  0400 Gross per 24 hour  Intake 4401.91 ml  Output -  Net 4401.91 ml   Filed Weights   02/09/18 0340 02/10/18 0400 02/18/2018 0500  Weight: 52.5 kg 56.5 kg 51.9 kg    Examination:  General exam: Somnolent, does not wake up to voice Respiratory system: Bilateral rhonchi, short shallow breaths Cardiovascular system: Irregular, tachycardic. No murmurs, rubs, gallops. Gastrointestinal system: Abdomen is nondistended, soft and nontender. No organomegaly or masses felt. Normal bowel sounds heard. Central nervous system: No focal neurological deficits. Extremities: No C/C/E, +pedal pulses Skin: No rashes, lesions or ulcers Psychiatry: Unresponsive, does not respond to voice      Data Reviewed: I have personally reviewed following labs and imaging studies  CBC: Recent Labs  Lab 2018/03/03 1305 Mar 03, 2018 1338 02/08/18 0457 02/09/18 0452 02/10/18 0404 02/28/2018 0418  WBC 25.2*  --  25.0* 23.5* 25.4* 28.3*  NEUTROABS 22.4*  --  22.6* 22.6* 24.4* 26.6*  HGB 10.2* 10.9* 9.2* 8.3* 7.2* 8.0*  HCT 34.8* 32.0* 33.1* 29.5* 25.8* 29.1*  MCV 80.9  --  83.4 83.8 84.9 84.1  PLT 554*  --  454* 433* 354 295   Basic Metabolic Panel: Recent Labs  Lab Mar 03, 2018 1305 03-03-2018 1338 02/08/18 0457 02/09/18 0452 02/10/18 0404 02/28/2018 0602  NA 149* 151* 149* 147* 147* 145  K 3.4* 3.6 3.4* 3.5 3.5 5.2*  CL 118* 116* 122* 119* 124* 124*  CO2 21*  --  18* 18* 18* 15*  GLUCOSE 102* 98 107* 209* 205* 179*  BUN 26* 29* 25* 26* 24* 26*  CREATININE 1.01* 1.00 1.10* 1.16* 1.11* 1.25*  CALCIUM 8.0*  --  7.8* 8.0* 8.0* 8.1*  MG  --   --  2.0  --   --   --    GFR: Estimated Creatinine Clearance: 30.9 mL/min (A) (by C-G formula based on SCr of 1.25 mg/dL (H)). Liver Function Tests: Recent Labs  Lab 2018/03/03 1305 02/08/18 0457 02/09/18 0452 02/10/18 0404 02/05/2018 0602  AST 43* 75* 37 26 206*  ALT 44 64* 52* 41 137*  ALKPHOS 121 114 104 88 102  BILITOT 0.5 0.5 0.4 0.6 0.7  PROT 6.5 6.0* 5.8*  5.3* 5.7*  ALBUMIN 2.4* 2.3* 2.0* 1.9* 2.1*   No results for input(s): LIPASE, AMYLASE in the last 168 hours. No results for input(s): AMMONIA in the last 168 hours. Coagulation Profile: No results for input(s): INR, PROTIME in the last 168 hours. Cardiac Enzymes: Recent Labs  Lab Mar 03, 2018 1305  TROPONINI <0.03   BNP (last 3 results) No results for input(s): PROBNP in the last 8760 hours. HbA1C: No results for input(s): HGBA1C in the last 72 hours. CBG: Recent Labs  Lab 02/08/18 0743 02/08/18 1211 02/08/18 1615  GLUCAP 116* 78 70   Lipid Profile: No results for input(s): CHOL, HDL, LDLCALC, TRIG, CHOLHDL, LDLDIRECT in the last 72 hours. Thyroid Function Tests: No results for input(s): TSH, T4TOTAL, FREET4, T3FREE, THYROIDAB in the last 72 hours. Anemia Panel: No results for input(s): VITAMINB12, FOLATE, FERRITIN, TIBC, IRON, RETICCTPCT in the last 72 hours. Sepsis Labs: Recent Labs  Lab 03-03-2018 1325 2018-03-03 1527  LATICACIDVEN 2.47*  3.22*    Recent Results (from the past 240 hour(s))  Blood Culture (routine x 2)     Status: None (Preliminary result)   Collection Time: 02/18/2018  1:11 PM  Result Value Ref Range Status   Specimen Description BLOOD LEFT ARM  Final   Special Requests   Final    BOTTLES DRAWN AEROBIC AND ANAEROBIC Blood Culture adequate volume   Culture   Final    NO GROWTH 4 DAYS Performed at [facility], [address], [place], [place] [address]    Report Status PENDING  Incomplete  Blood Culture (routine x 2)     Status: None (Preliminary result)   Collection Time: 02/26/2018  1:24 PM  Result Value Ref Range Status   Specimen Description BLOOD LEFT ARM  Final   Special Requests   Final    BOTTLES DRAWN AEROBIC AND ANAEROBIC Blood Culture adequate volume   Culture   Final    NO GROWTH 4 DAYS Performed at [facility], [address] [place], [place] [address]    Report Status PENDING  Incomplete  Urine culture     Status: Abnormal    Collection Time: 02/03/2018  1:30 PM  Result Value Ref Range Status   Specimen Description   Final    URINE, CLEAN CATCH Performed at [facility], [address] [place], [place] [address]    Special Requests   Final    NONE Performed at [facility], [address] [place], [place] [address]    Culture >=100,000 COLONIES/mL VIRIDANS STREPTOCOCCUS (A)  Final   Report Status February 27, 2018 FINAL  Final  Respiratory Panel by PCR     Status: None   Collection Time: 03/04/2018  6:11 PM  Result Value Ref Range Status   Adenovirus NOT DETECTED NOT DETECTED Final   Coronavirus 229E NOT DETECTED NOT DETECTED Final   Coronavirus HKU1 NOT DETECTED NOT DETECTED Final   Coronavirus NL63 NOT DETECTED NOT DETECTED Final   Coronavirus OC43 NOT DETECTED NOT DETECTED Final   Metapneumovirus NOT DETECTED NOT DETECTED Final   Rhinovirus / Enterovirus NOT DETECTED NOT DETECTED Final   Influenza A NOT DETECTED NOT DETECTED Final   Influenza B NOT DETECTED NOT DETECTED Final   Parainfluenza Virus 1 NOT DETECTED NOT DETECTED Final   Parainfluenza Virus 2 NOT DETECTED NOT DETECTED Final   Parainfluenza Virus 3 NOT DETECTED NOT DETECTED Final   Parainfluenza Virus 4 NOT DETECTED NOT DETECTED Final   Respiratory Syncytial Virus NOT DETECTED NOT DETECTED Final   Bordetella pertussis NOT DETECTED NOT DETECTED Final   Chlamydophila pneumoniae NOT DETECTED NOT DETECTED Final   Mycoplasma pneumoniae NOT DETECTED NOT DETECTED Final    Comment: Performed at [facility], [address], [place], [place] [address]  MRSA PCR Screening     Status: None   Collection Time: 02/26/2018  6:11 PM  Result Value Ref Range Status   MRSA by PCR NEGATIVE NEGATIVE Final    Comment:        The GeneXpert MRSA Assay (FDA approved for NASAL specimens only), is one component of a comprehensive MRSA colonization surveillance program. It is not intended to diagnose MRSA infection nor to guide or monitor treatment for MRSA  infections. Performed at [facility], [address], [place], [place] [address]   Culture, respiratory     Status: None   Collection Time: 02/08/18  2:04 AM  Result Value Ref Range Status   Specimen Description   Final  SPUTUM Performed at [facility], [address], [place], [place] [address]    Special Requests   Final    NONE Performed at [facility], [address] [place], [place] [address]    Gram Stain   Final    ABUNDANT WBC PRESENT, PREDOMINANTLY PMN RARE GRAM POSITIVE RODS Performed at [facility], [address], [place], [place] [address]    Culture RARE STAPHYLOCOCCUS AUREUS  Final   Report Status 02/10/2018 FINAL  Final   Organism ID, Bacteria STAPHYLOCOCCUS AUREUS  Final      Susceptibility   Staphylococcus aureus - MIC*    CIPROFLOXACIN <=0.5 SENSITIVE Sensitive     ERYTHROMYCIN >=8 RESISTANT Resistant     GENTAMICIN <=0.5 SENSITIVE Sensitive     OXACILLIN 0.5 SENSITIVE Sensitive     TETRACYCLINE <=1 SENSITIVE Sensitive     VANCOMYCIN <=0.5 SENSITIVE Sensitive     TRIMETH/SULFA <=10 SENSITIVE Sensitive     CLINDAMYCIN RESISTANT Resistant     RIFAMPIN <=0.5 SENSITIVE Sensitive     Inducible Clindamycin POSITIVE Resistant     * RARE STAPHYLOCOCCUS AUREUS         Radiology Studies: No results found.      Scheduled Meds: . mouth rinse  15 mL Mouth Rinse BID  . scopolamine  1 patch Transdermal Q72H   Continuous Infusions:    LOS: 4 days    Time spent:    Erick Blinks, MD [facility] Pager [phone]  If 7PM-7AM, please contact night-coverage [email or web address] Password [email or web address] 02/16/2018, 5:42 PM

## 2018-03-05 NOTE — Clinical Social Work Note (Signed)
CSW spoke with [place] [facility].  If no bed at hospice is available tomorrow, hospice nurse will come at 9:30 to do virtual bed assessment.

## 2018-03-05 NOTE — Progress Notes (Signed)
Pt passed away at 2300. Time of death verified by myself and [facility]. Contacted Doylene Bode [facility]) and Dr. Sharl Ma and patients sister Karen Larson. Pt sister came to bedside. Advised will use [facility] in [place]. Patient placement called. [facility] here to pick up body.  Spoke with [employer], [place], who advised the patient was not a candidate for any donation and was a full release. Service number: [phone]

## 2018-03-05 DEATH — deceased

## 2019-08-22 IMAGING — CR DG PELVIS 1-2V
1 series · 1 of 1 positions shown · non-contrast
Comparison: None.

CLINICAL DATA: Pain after fall

EXAM:
PELVIS - 1-2 VIEW

[x pelvis]
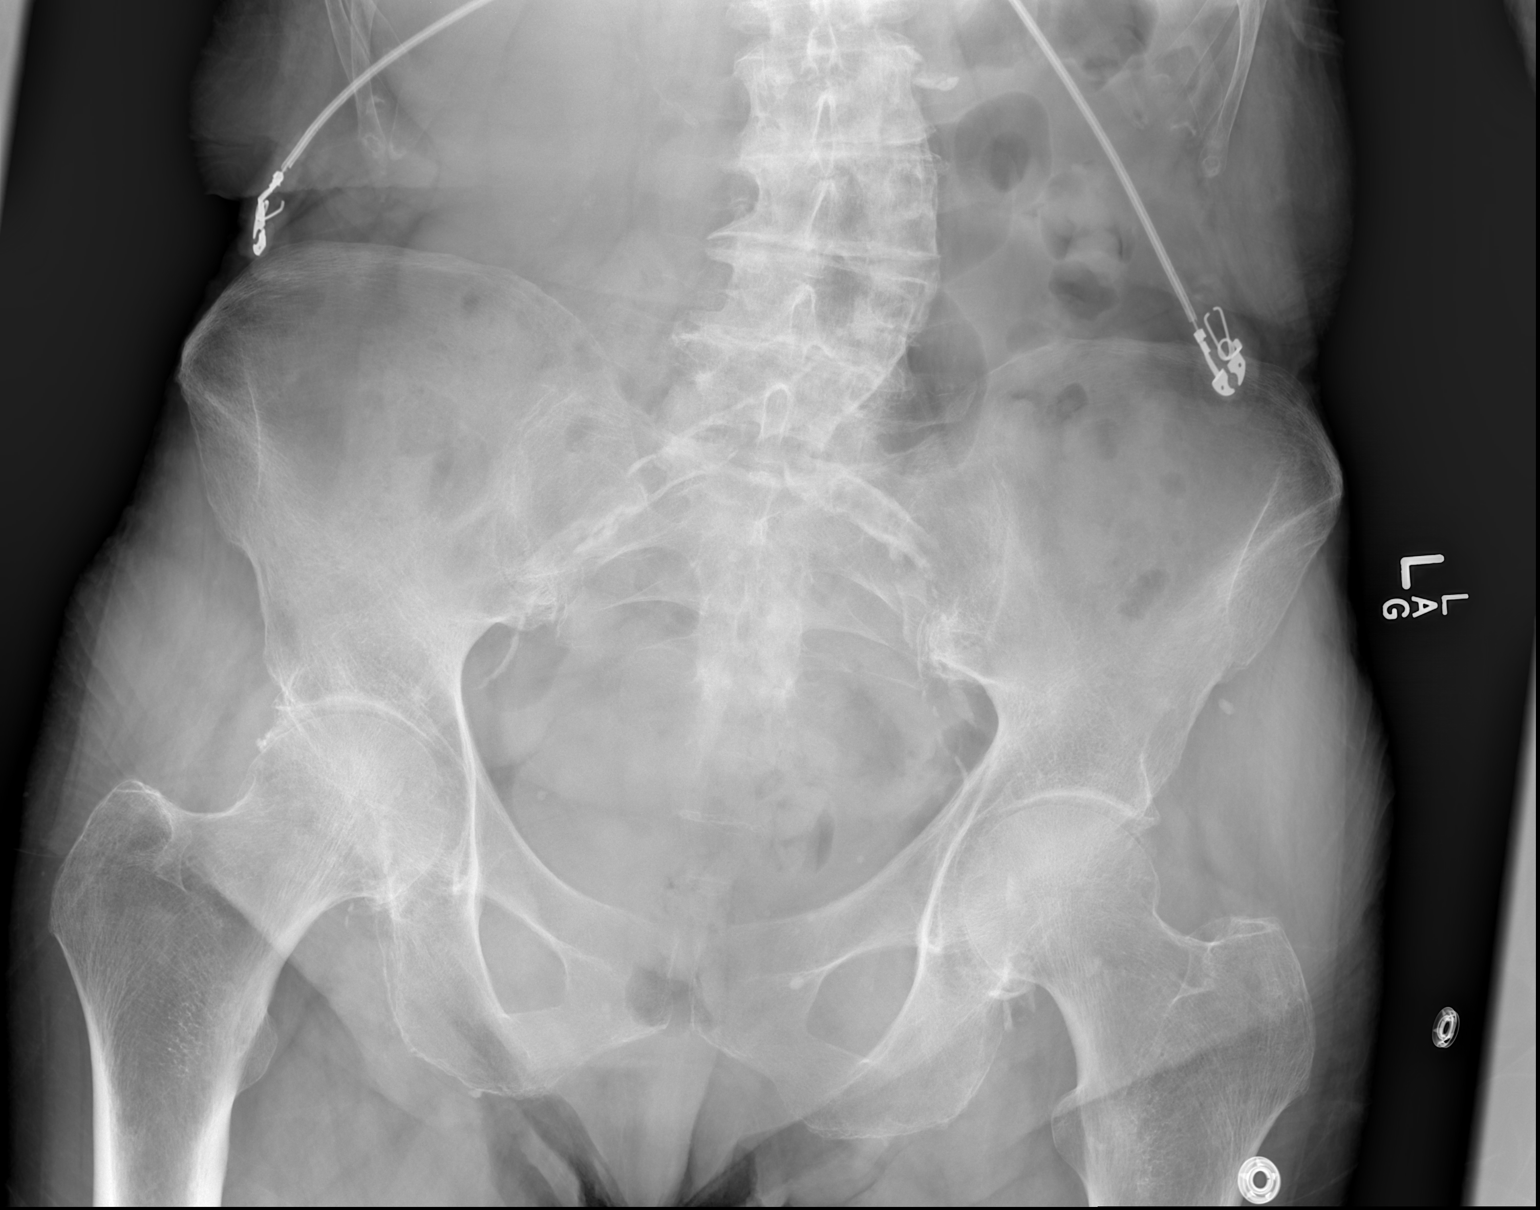

[1 of 1 positions shown; findings below may reference images not displayed]

FINDINGS: Dense calcified atherosclerosis in the aorta and iliac vessels.
Degenerative changes in the lower lumbar spine. No acute fractures.
IMPRESSION: Negative.

## 2020-02-29 IMAGING — CT CT HEAD W/O CM
3 series · 15 of 47 positions shown, 18 images · non-contrast
Comparison: CT HEAD July 31, 2017

CLINICAL DATA: Altered mental status, worsening for 5 days.
Incontinence. History of dementia, hypertension and atrial
fibrillation.

EXAM:
CT HEAD WITHOUT CONTRAST
TECHNIQUE: Contiguous axial images were obtained from the base of the skull
through the vertex without intravenous contrast.

[Series 2: head wo · axial · 0.46mm/px · z∈[+57,+197]mm · 9 of 34 slices shown, 12 images]
[im 3/34  brain]
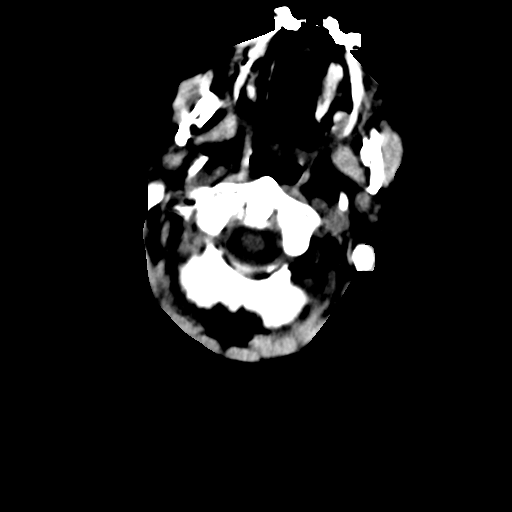
[im 3/34  bone]
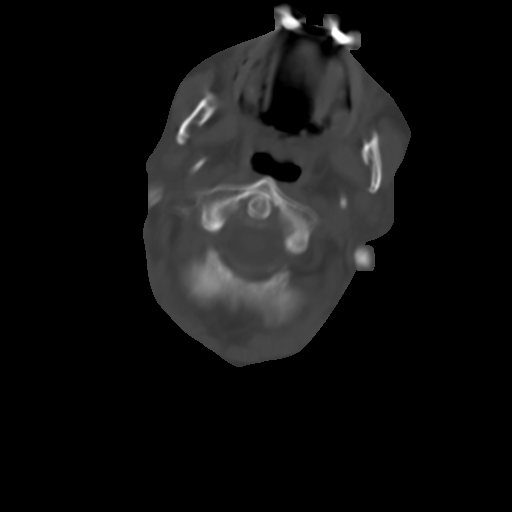
[im 6/34  brain]
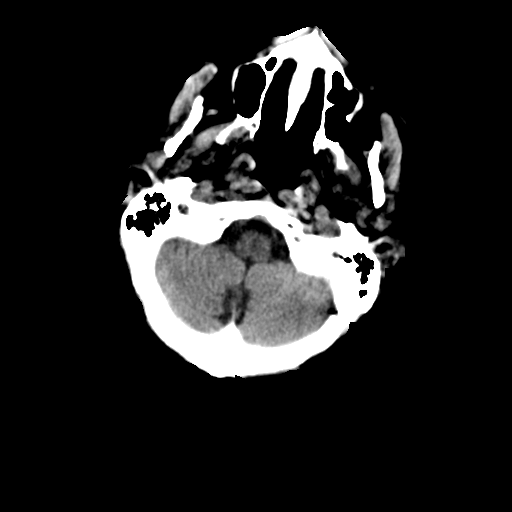
[im 10/34  brain]
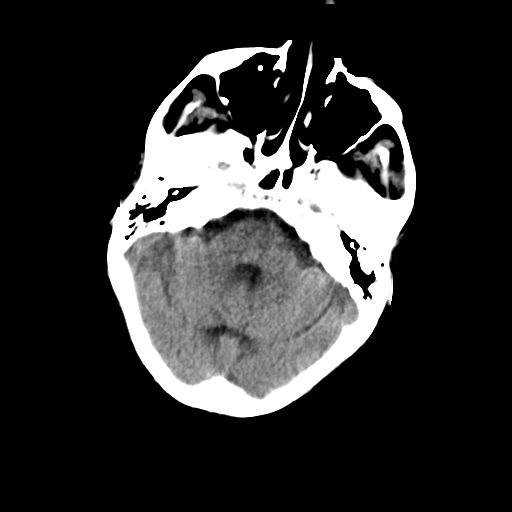
[im 13/34  brain]
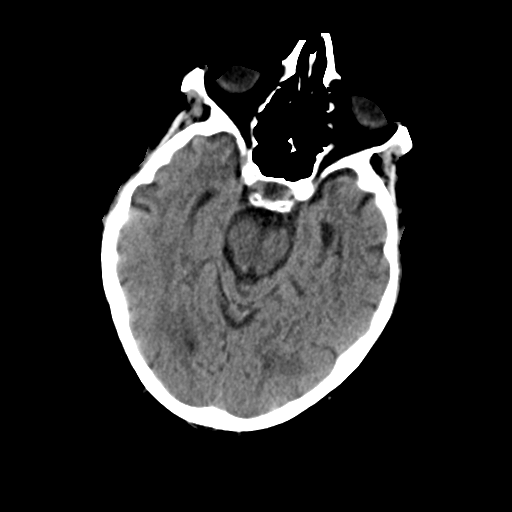
[im 18/34  brain]
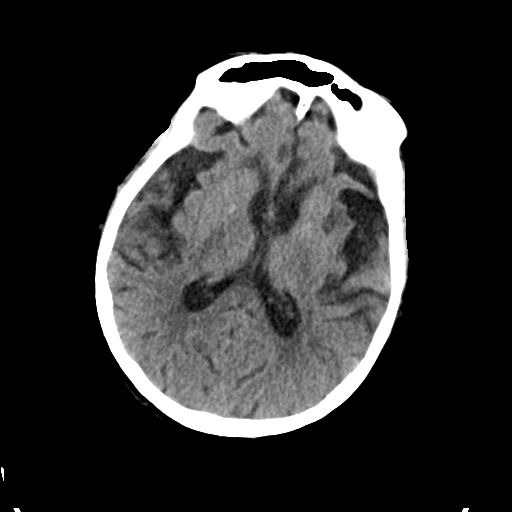
[im 18/34  bone]
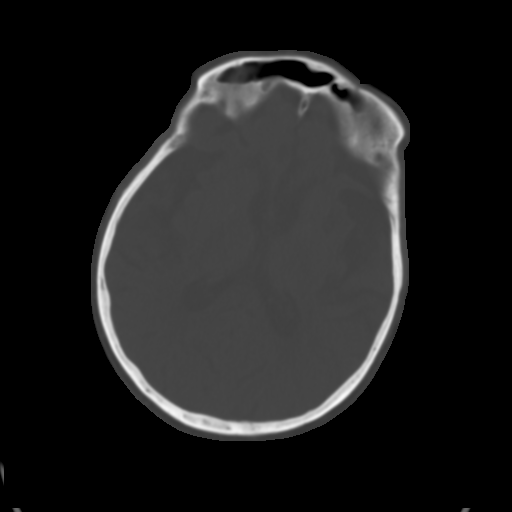
[im 21/34  brain]
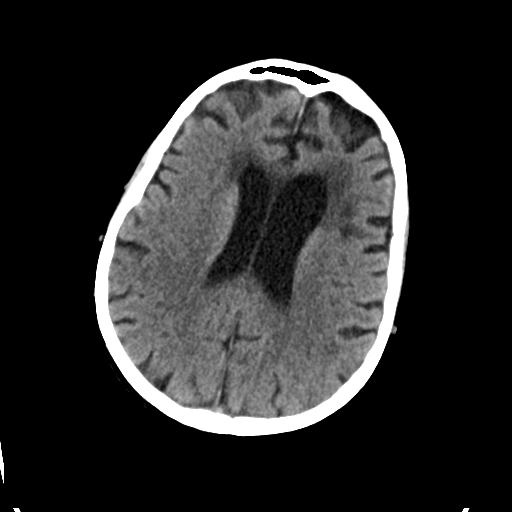
[im 24/34  brain]
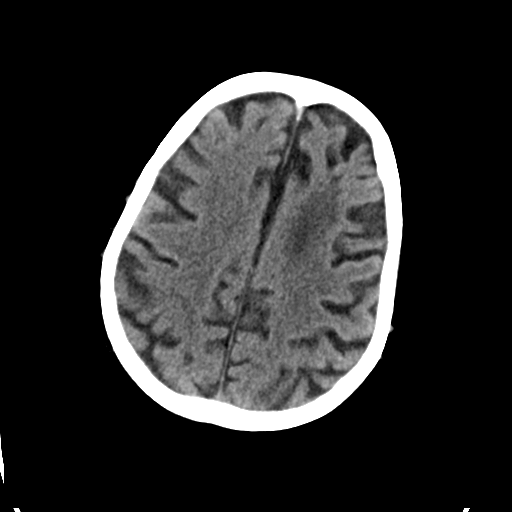
[im 28/34  brain]
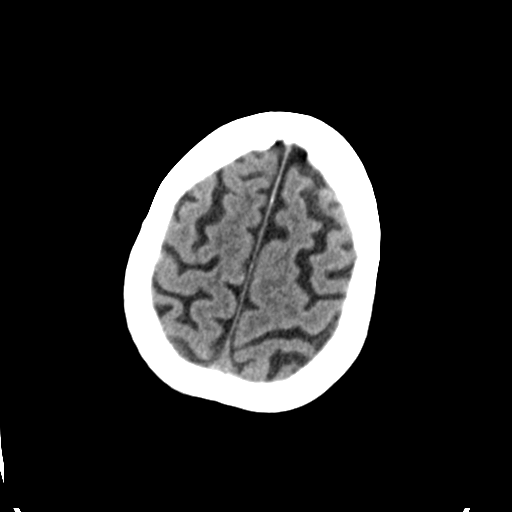
[im 31/34  brain]
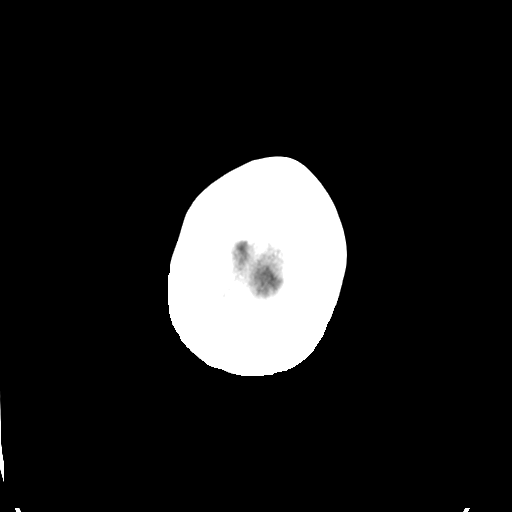
[im 31/34  bone]
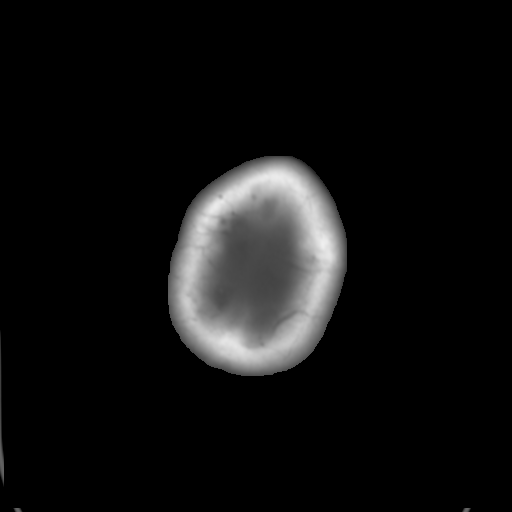

[Series 4: coronal soft tissue · coronal · 0.34mm/px · 3 of 73 slices shown]
[im 25/73  brain]
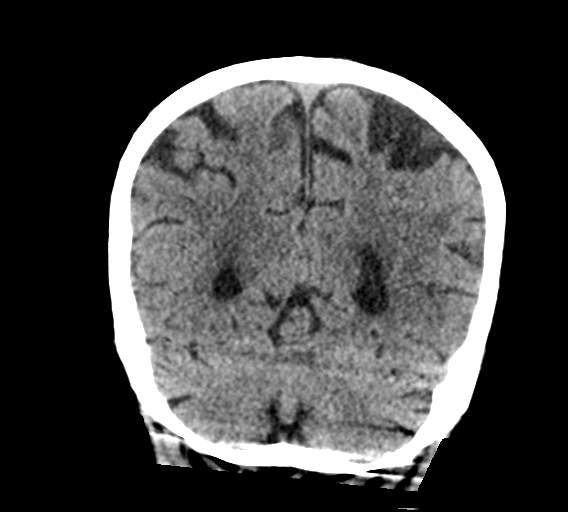
[im 33/73  brain]
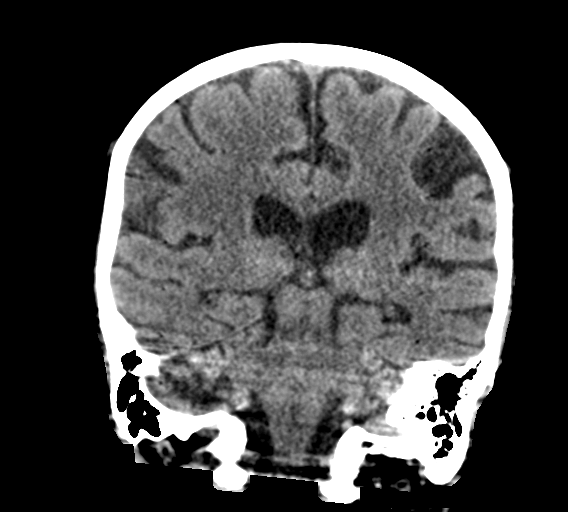
[im 41/73  brain]
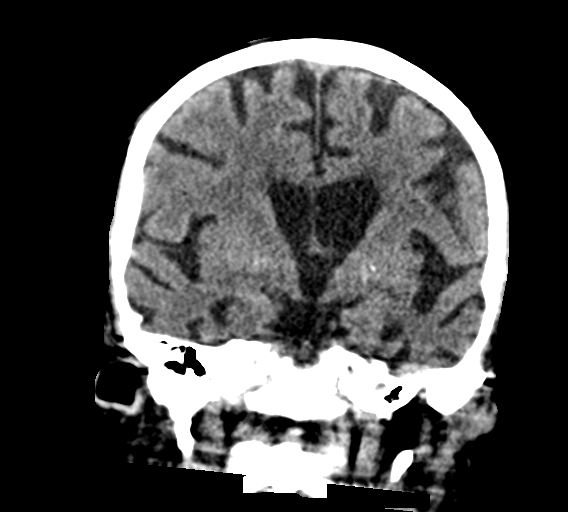

[Series 5: sagittal soft tissue · sagittal · 0.36mm/px · 3 of 55 slices shown]
[im 19/55  brain]
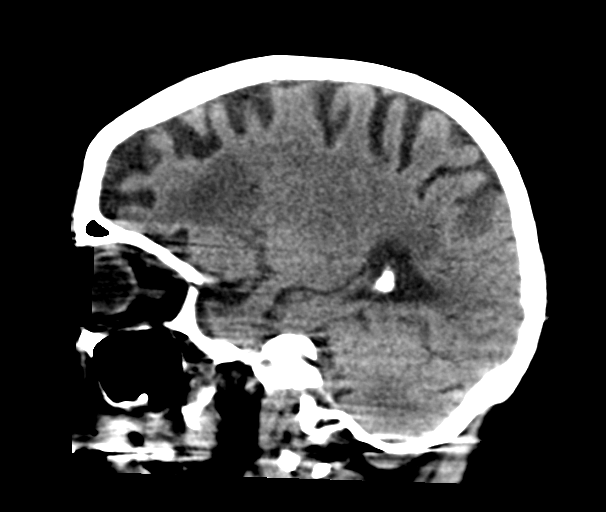
[im 28/55  brain]
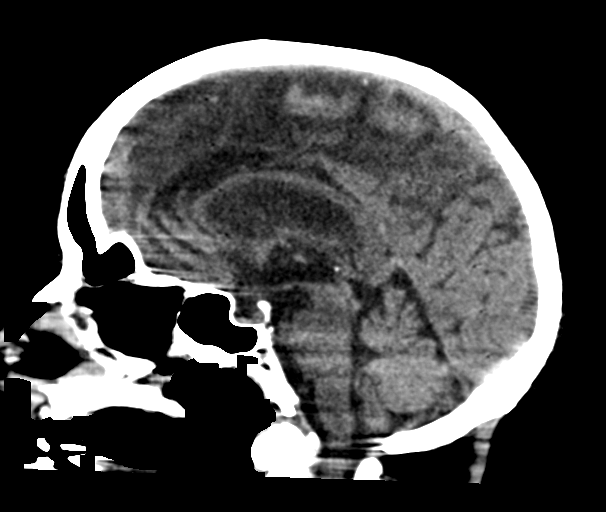
[im 37/55  brain]
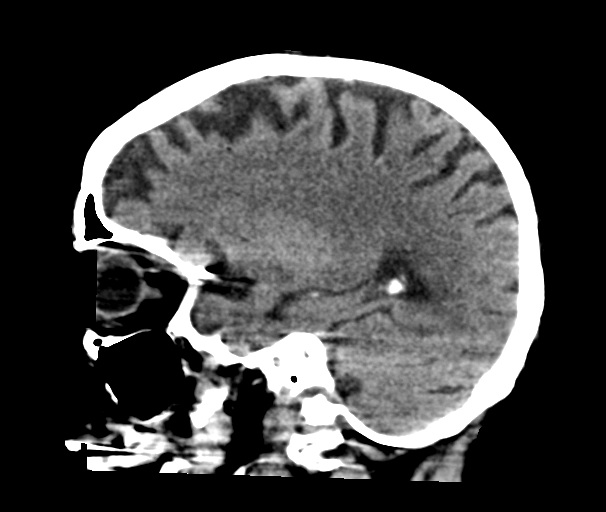

[15 of 47 positions shown; findings below may reference images not displayed]

FINDINGS: Mild motion degraded examination.

BRAIN: No intraparenchymal hemorrhage, mass effect nor midline
shift. Mild ex vacuo dilatation LEFT frontal horn. No hydrocephalus.
Patchy predominately bifrontal supratentorial white matter
hypodensities are similar. No acute large vascular territory
infarcts. No abnormal extra-axial fluid collections. Basal cisterns
are patent.

VASCULAR: Moderate calcific atherosclerosis of the carotid siphons.

SKULL: No skull fracture. No significant scalp soft tissue swelling.

SINUSES/ORBITS: Trace paranasal sinus mucosal thickening. Mastoid
air cells are well aerated.The included ocular globes and orbital
contents are non-suspicious.

OTHER: None.
IMPRESSION: 1. No acute intracranial process on this mildly motion degraded
examination.
2. Stable examination including mild asymmetric LEFT frontal volume
loss associated with neuro degenerative syndromes.

## 2020-03-01 IMAGING — CR DG CHEST 1V PORT
1 series · 1 of 1 positions shown · non-contrast
Comparison: Chest x-ray from yesterday.

CLINICAL DATA: Pneumonia.

EXAM:
PORTABLE CHEST 1 VIEW

[portable]
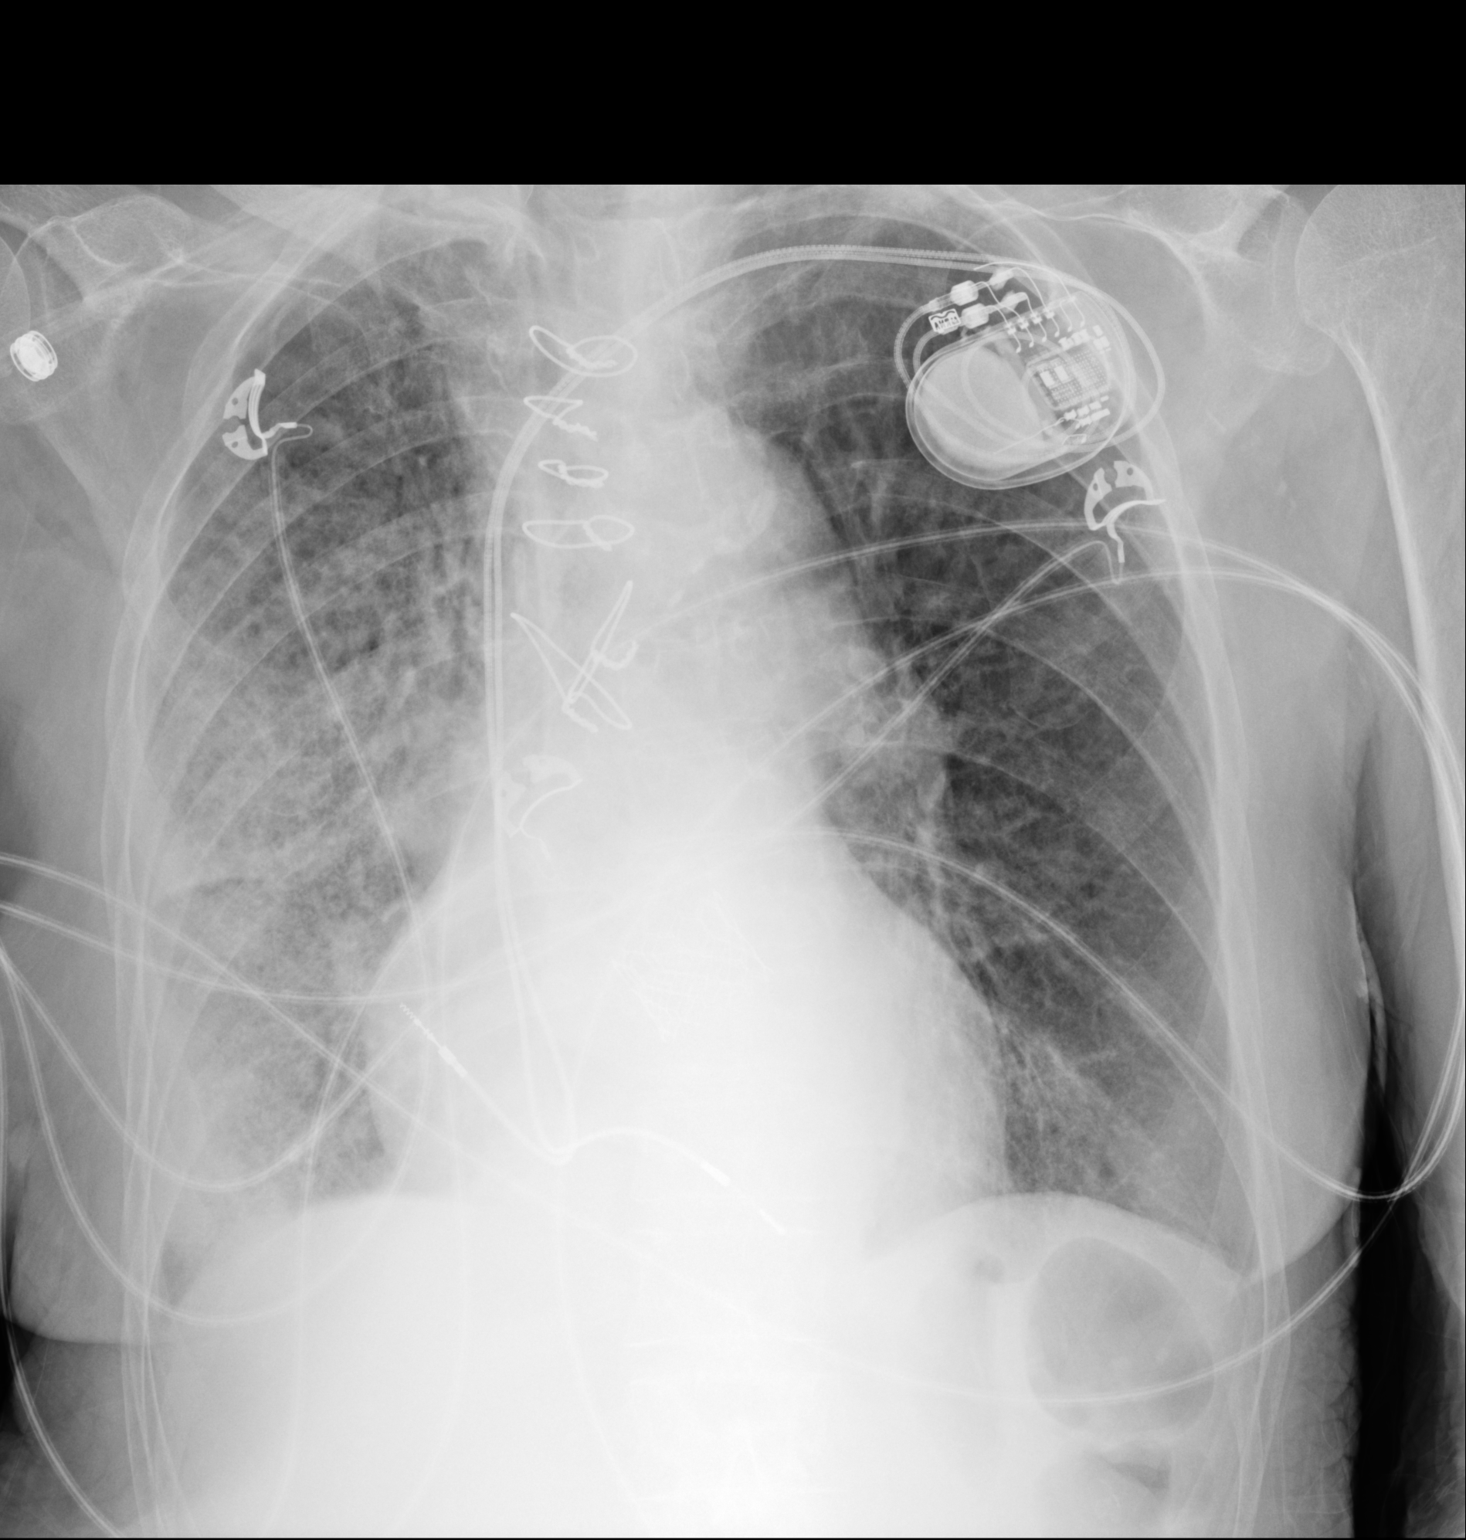

[1 of 1 positions shown; findings below may reference images not displayed]

FINDINGS: Unchanged left chest wall pacemaker. Stable cardiomediastinal
silhouette status post TAVR. Atherosclerotic calcification of the
aortic arch. Diffuse airspace disease within the right mid to lower
lung is unchanged. Mild atelectasis at the left lung base. No
pleural effusion or pneumothorax. No acute osseous abnormality.
IMPRESSION: Unchanged multifocal pneumonia in the right lung.
